# Patient Record
Sex: Male | Born: 1945 | Race: White | Hispanic: No | Marital: Married | Smoking: Former smoker
Health system: Southern US, Community
[De-identification: ages and names within clinical notes are randomized; demographics above are authoritative.]

## PROBLEM LIST (undated history)

## (undated) DIAGNOSIS — L409 Psoriasis, unspecified: Secondary | ICD-10-CM

## (undated) DIAGNOSIS — I10 Essential (primary) hypertension: Secondary | ICD-10-CM

## (undated) DIAGNOSIS — K5792 Diverticulitis of intestine, part unspecified, without perforation or abscess without bleeding: Secondary | ICD-10-CM

## (undated) DIAGNOSIS — N529 Male erectile dysfunction, unspecified: Secondary | ICD-10-CM

## (undated) DIAGNOSIS — I6523 Occlusion and stenosis of bilateral carotid arteries: Secondary | ICD-10-CM

## (undated) DIAGNOSIS — I2510 Atherosclerotic heart disease of native coronary artery without angina pectoris: Secondary | ICD-10-CM

## (undated) DIAGNOSIS — E782 Mixed hyperlipidemia: Secondary | ICD-10-CM

## (undated) DIAGNOSIS — I219 Acute myocardial infarction, unspecified: Secondary | ICD-10-CM

## (undated) HISTORY — DX: Male erectile dysfunction, unspecified: N52.9

## (undated) HISTORY — DX: Acute myocardial infarction, unspecified: I21.9

## (undated) HISTORY — DX: Atherosclerotic heart disease of native coronary artery without angina pectoris: I25.10

## (undated) HISTORY — PX: CORONARY ANGIOPLASTY: SHX604

## (undated) HISTORY — DX: Occlusion and stenosis of bilateral carotid arteries: I65.23

## (undated) HISTORY — PX: TONSILLECTOMY: SUR1361

## (undated) HISTORY — DX: Mixed hyperlipidemia: E78.2

## (undated) HISTORY — DX: Diverticulitis of intestine, part unspecified, without perforation or abscess without bleeding: K57.92

## (undated) HISTORY — DX: Psoriasis, unspecified: L40.9

## (undated) HISTORY — DX: Essential (primary) hypertension: I10

---

## 2004-06-21 ENCOUNTER — Inpatient Hospital Stay: Payer: Self-pay | Admitting: Internal Medicine

## 2010-05-08 ENCOUNTER — Inpatient Hospital Stay: Payer: Self-pay | Admitting: *Deleted

## 2012-05-11 IMAGING — CR DG CHEST 2V
1 series · 2 of 2 positions shown · non-contrast
Comparison: none

REASON FOR EXAM: wheezing
COMMENTS:

PROCEDURE:     DXR - DXR CHEST PA (OR AP) AND LATERAL  - May 08, 2010  [DATE]
RESULT:     Comparison is made to prior study same date earlier time.

[Series 1: view not recorded · 0.17mm/px · 2 of 2 slices shown]
[im 1/2]
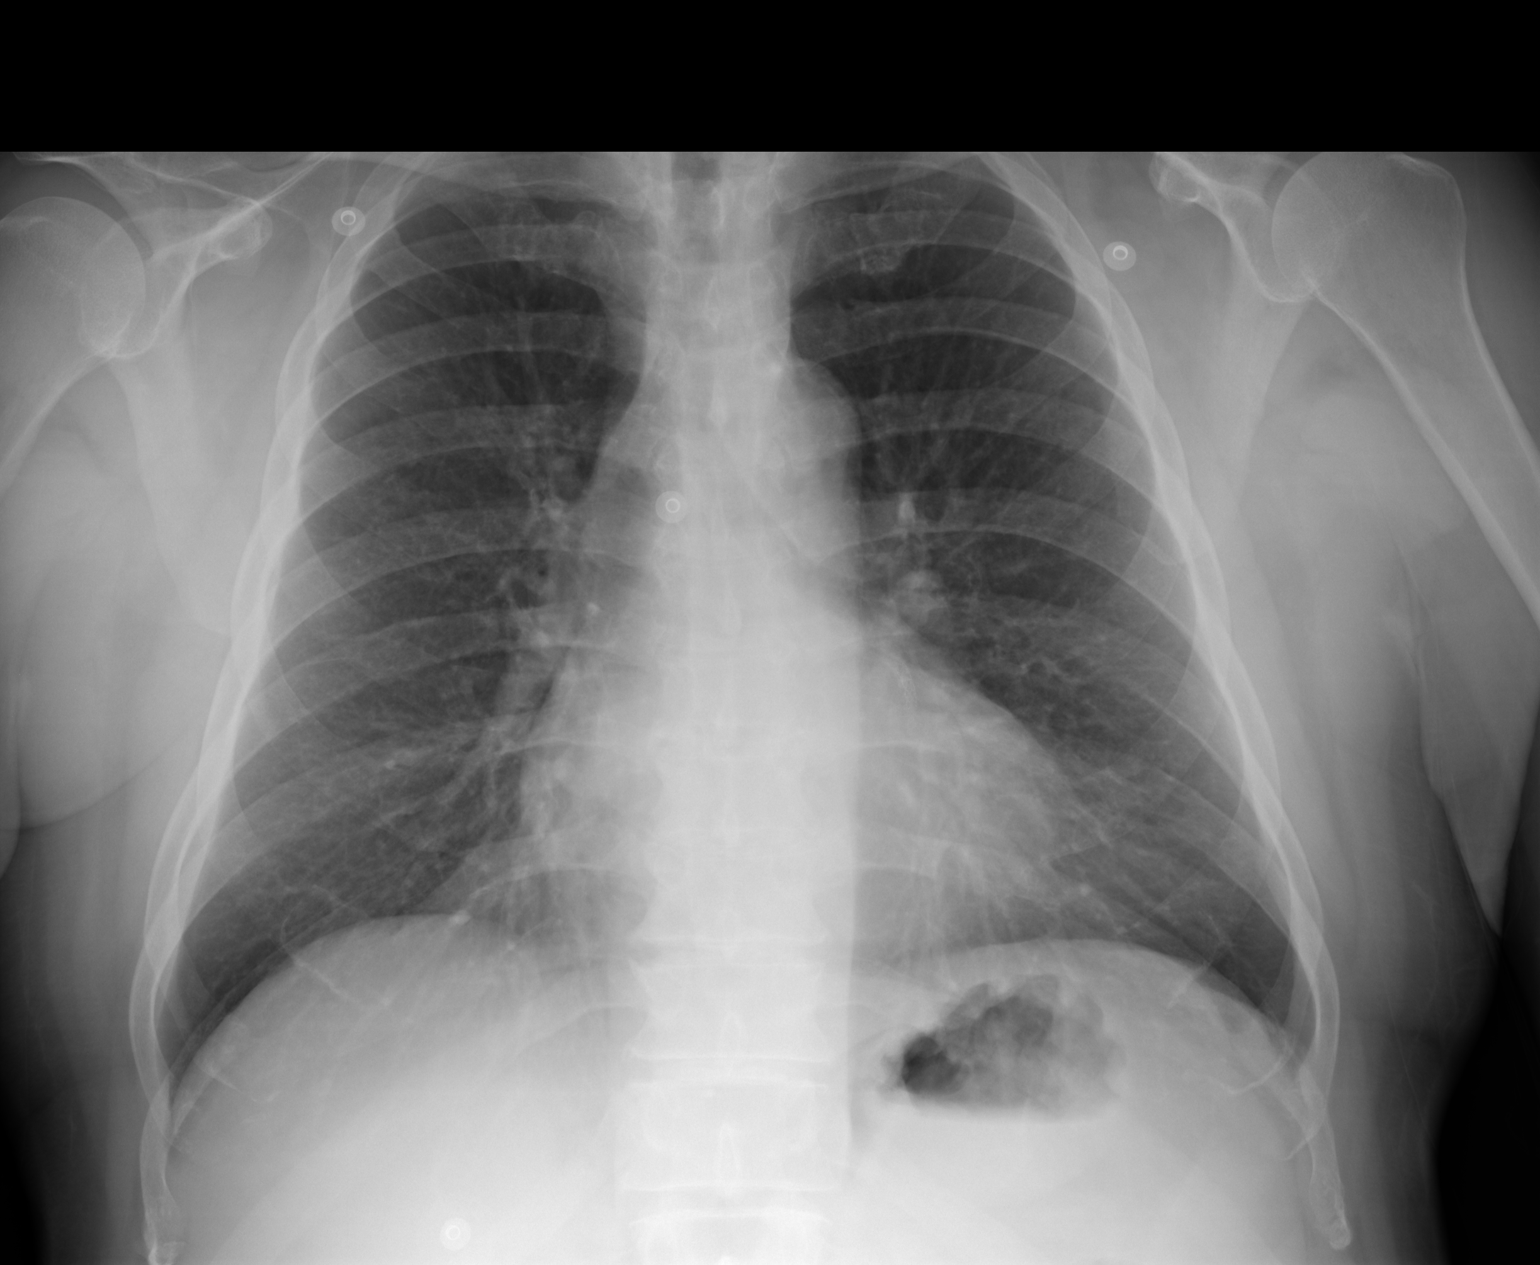
[im 2/2]
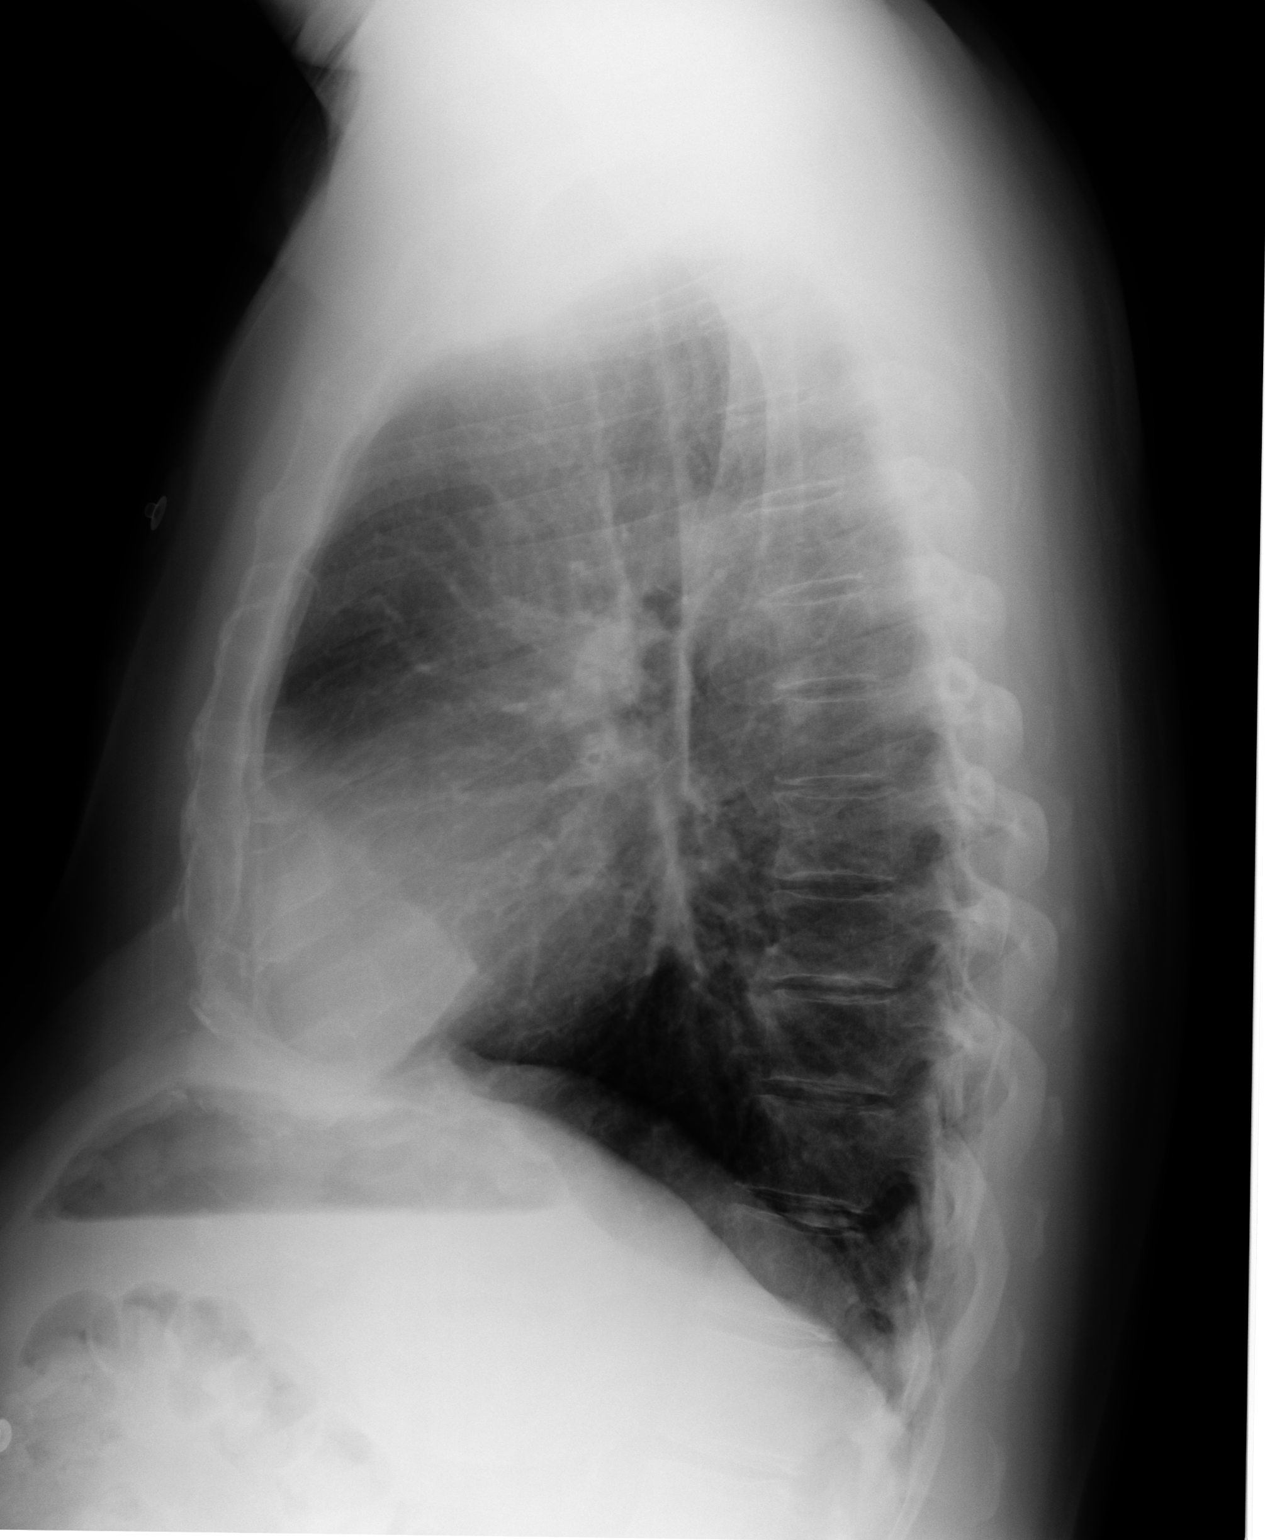

[2 of 2 positions shown; findings below may reference images not displayed]

FINDINGS: There is mild increased prominence of the interstitial markings as
well as a component of cephalization. No focal regions of consolidation or
focal infiltrates are appreciated. The cardiac silhouette and visualized
bony skeleton are unremarkable.
IMPRESSION: Findings possibly representing an element of mild pulmonary
edema. No focal regions of consolidation are appreciated.

## 2012-10-24 DIAGNOSIS — I2510 Atherosclerotic heart disease of native coronary artery without angina pectoris: Secondary | ICD-10-CM | POA: Insufficient documentation

## 2012-10-24 DIAGNOSIS — L409 Psoriasis, unspecified: Secondary | ICD-10-CM | POA: Insufficient documentation

## 2012-11-01 DIAGNOSIS — K5792 Diverticulitis of intestine, part unspecified, without perforation or abscess without bleeding: Secondary | ICD-10-CM | POA: Insufficient documentation

## 2014-02-11 DIAGNOSIS — I2510 Atherosclerotic heart disease of native coronary artery without angina pectoris: Secondary | ICD-10-CM | POA: Insufficient documentation

## 2014-02-11 DIAGNOSIS — I25110 Atherosclerotic heart disease of native coronary artery with unstable angina pectoris: Secondary | ICD-10-CM | POA: Insufficient documentation

## 2014-03-03 ENCOUNTER — Ambulatory Visit: Payer: Self-pay | Admitting: Internal Medicine

## 2014-03-04 LAB — BASIC METABOLIC PANEL
ANION GAP: 7 (ref 7–16)
BUN: 16 mg/dL (ref 7–18)
CALCIUM: 8 mg/dL — AB (ref 8.5–10.1)
CHLORIDE: 104 mmol/L (ref 98–107)
CREATININE: 1.29 mg/dL (ref 0.60–1.30)
Co2: 29 mmol/L (ref 21–32)
EGFR (African American): >60
EGFR (Non-African Amer.): 59 — ABNORMAL LOW
Glucose: 102 mg/dL — ABNORMAL HIGH (ref 65–99)
OSMOLALITY: 281 (ref 275–301)
Potassium: 4.1 mmol/L (ref 3.5–5.1)
Sodium: 140 mmol/L (ref 136–145)

## 2014-03-04 LAB — CK TOTAL AND CKMB (NOT AT ARMC)
CK, TOTAL: 50 U/L (ref 39–308)
CK-MB: 0.6 ng/mL (ref 0.5–3.6)

## 2014-05-20 DIAGNOSIS — E782 Mixed hyperlipidemia: Secondary | ICD-10-CM | POA: Insufficient documentation

## 2014-07-05 NOTE — Discharge Summary (Signed)
PATIENT NAME:  Daniel Miles, Daniel Miles MR#:  [id number] DATE OF BIRTH:  03-13-1946  DATE OF ADMISSION:  03/03/2014 DATE OF DISCHARGE:    DISCHARGE DIAGNOSES:  1.  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  2.  Coronary artery disease.  3.  Hypertension.  4.  Hyperlipidemia.   HISTORY OF PRESENT ILLNESS: This is a 69 year old male with known coronary artery disease status post previous stent placement in the left anterior descending artery, who had progression of shortness of breath, chest discomfort with and without physical activity, becoming unstable at the time with [place] class III anginal equivalent on appropriate medication management including nitrates, beta blocker, ACE inhibitor, aspirin, with no improvement. The patient has had a stress test showing high risk myocardial perfusion defect and myocardial ischemia to the anterior and apical wall with high Duke treadmill score and angina at peak stress. By cardiac catheterization he had an occlusion of the left anterior descending at a mid LAD stent site and underwent a PCI and drug-eluting stent placement at that time without complication, on appropriate medication management. He was given appropriate medication management listed below and ambulated without any further significant symptoms. Cardiac rehabilitation was discussed and the patient understood all risks and benefits of medication management. He reached his maximal hospital benefit and discharged to home with medications listed below.     ____________________________ Daniel BlinksBruce Miles. Daniel Veasey, MD bjk:bu D: 03/03/2014 16:41:46 ET T: 03/03/2014 18:24:34 ET JOB#: [id number]  cc: Daniel BlinksBruce Miles. Daniel Anderegg, MD, <Dictator> Daniel BlinksBRUCE Miles Daniel Splitt MD ELECTRONICALLY SIGNED 03/05/2014 8:11

## 2014-09-24 DIAGNOSIS — I10 Essential (primary) hypertension: Secondary | ICD-10-CM | POA: Insufficient documentation

## 2015-07-27 ENCOUNTER — Encounter: Payer: Self-pay | Admitting: *Deleted

## 2015-07-30 ENCOUNTER — Telehealth: Payer: Self-pay | Admitting: Urology

## 2015-07-30 ENCOUNTER — Ambulatory Visit (INDEPENDENT_AMBULATORY_CARE_PROVIDER_SITE_OTHER): Payer: Medicare HMO | Admitting: Urology

## 2015-07-30 ENCOUNTER — Encounter: Payer: Self-pay | Admitting: Urology

## 2015-07-30 VITALS — BP 122/74 | HR 61 | Ht 66.0 in | Wt 188.1 lb

## 2015-07-30 DIAGNOSIS — N529 Male erectile dysfunction, unspecified: Secondary | ICD-10-CM

## 2015-07-30 DIAGNOSIS — N400 Enlarged prostate without lower urinary tract symptoms: Secondary | ICD-10-CM | POA: Diagnosis not present

## 2015-07-30 DIAGNOSIS — N528 Other male erectile dysfunction: Secondary | ICD-10-CM | POA: Diagnosis not present

## 2015-07-30 NOTE — Telephone Encounter (Signed)
Would you call in Trimix for this patient at [facility]?

## 2015-07-30 NOTE — Progress Notes (Signed)
07/30/2015 5:08 PM   Daniel Miles [birth year] [id number]  Referring provider: No referring provider defined for this encounter.  Chief Complaint  Patient presents with  . Erectile Dysfunction    referred by Feldpausch    HPI: Patient is a 70 year old Caucasian male who is referred to us by Dr. Maryjane HurterFeldpausch for erectile dysfunction.  His SHIM score is 12, which is mild to moderate erectile dysfunction.   He has been having difficulty with erections for the last several years.   His major complaint is he elects confidence to get an adequate erection for sexual activity.  His libido is preserved.   His risk factors for ED are age, CAD, HTN, MI, HLD and anticoagulant therapy.   He denies any painful erections or curvatures with his erections.   He has tried Viagra in the past and it was effective, but he is not taking good candidates for the medication due to his Imdur.      SHIM      07/30/15 1655       SHIM: Over the last 6 months:   How do you rate your confidence that you could get and keep an erection? Very Low     When you had erections with sexual stimulation, how often were your erections hard enough for penetration (entering your partner)? Sometimes (about half the time)     During sexual intercourse, how often were you able to maintain your erection after you had penetrated (entered) your partner? Difficult     During sexual intercourse, how difficult was it to maintain your erection to completion of intercourse? Difficult     When you attempted sexual intercourse, how often was it satisfactory for you? Very Difficult     SHIM Total Score   SHIM 12        Score: 1-7 Severe ED 8-11 Moderate ED 12-16 Mild-Moderate ED 17-21 Mild ED 22-25 No ED   His IPSS score today is 0, which is no lower urinary tract symptomatology. He is pleased with his quality life due to his urinary symptoms.  He denies any dysuria, hematuria or suprapubic pain.  He also denies any  recent fevers, chills, nausea or vomiting.  He does not have a family history of PCa.      IPSS      07/30/15 1600       International Prostate Symptom Score   How often have you had the sensation of not emptying your bladder? Not at All     How often have you had to urinate less than every two hours? Not at All     How often have you found you stopped and started again several times when you urinated? Not at All     How often have you found it difficult to postpone urination? Not at All     How often have you had a weak urinary stream? Not at All     How often have you had to strain to start urination? Not at All     How many times did you typically get up at night to urinate? None     Total IPSS Score 0     Quality of Life due to urinary symptoms   If you were to spend the rest of your life with your urinary condition just the way it is now how would you feel about that? Pleased        Score:  1-7 Mild 8-19 Moderate 20-35 Severe  PMH: Past Medical History  Diagnosis Date  . ASHD (arteriosclerotic heart disease)   . Psoriasis   . Diverticulitis   . ED (erectile dysfunction)   . CAD (coronary artery disease)   . Mixed hyperlipidemia   . HTN (hypertension)   . Heart attack [facility])     Surgical History: Past Surgical History  Procedure Laterality Date  . Tonsillectomy    . Coronary angioplasty      Home Medications:    Medication List       This list is accurate as of: 07/30/15  5:08 PM.  Always use your most recent med list.               aspirin 325 MG tablet  Take by mouth.     atorvastatin 40 MG tablet  Commonly known as:  LIPITOR     clopidogrel 75 MG tablet  Commonly known as:  PLAVIX  Take by mouth.     FISH OIL PO  Take by mouth.     isosorbide mononitrate 30 MG 24 hr tablet  Commonly known as:  IMDUR  Take by mouth.     lisinopril 5 MG tablet  Commonly known as:  PRINIVIL,ZESTRIL  Take by mouth.     metoprolol tartrate 25 MG tablet    Commonly known as:  LOPRESSOR  Take by mouth.     nitroGLYCERIN 0.4 MG SL tablet  Commonly known as:  NITROSTAT  Place under the tongue.     vitamin C 1000 MG tablet  Take 1,000 mg by mouth daily.     Vitamin D3 3000 units Tabs  Take by mouth.     vitamin E 400 UNIT capsule  Take 400 Units by mouth daily.        Allergies:  Allergies  Allergen Reactions  . Penicillin G     Family History: Family History  Problem Relation Age of Onset  . Stroke Mother   . Lung cancer Father   . Hearing loss Brother   . Kidney disease Neg Hx   . Prostate cancer Neg Hx     Social History:  reports that he has quit smoking. He does not have any smokeless tobacco history on file. He reports that he drinks alcohol. He reports that he does not use illicit drugs.  ROS: UROLOGY Frequent Urination?: No Hard to postpone urination?: No Burning/pain with urination?: No Get up at night to urinate?: No Leakage of urine?: No Urine stream starts and stops?: No Trouble starting stream?: No Do you have to strain to urinate?: No Blood in urine?: No Urinary tract infection?: No Sexually transmitted disease?: No Injury to kidneys or bladder?: No Painful intercourse?: No Weak stream?: No Erection problems?: Yes Penile pain?: No  Gastrointestinal Nausea?: No Vomiting?: No Indigestion/heartburn?: No Diarrhea?: No Constipation?: No  Constitutional Fever: No Night sweats?: No Weight loss?: No Fatigue?: No  Skin Skin rash/lesions?: Yes Itching?: No  Eyes Blurred vision?: No Double vision?: No  Ears/Nose/Throat Sore throat?: No Sinus problems?: No  Hematologic/Lymphatic Swollen glands?: No Easy bruising?: No  Cardiovascular Leg swelling?: No Chest pain?: No  Respiratory Cough?: No Shortness of breath?: No  Endocrine Excessive thirst?: No  Musculoskeletal Back pain?: No Joint pain?: Yes  Neurological Headaches?: No Dizziness?: No  Psychologic Depression?:  No Anxiety?: No  Physical Exam: BP 122/74 mmHg  Pulse 61  Ht  (1.676 m)  Wt 188 lb 1.6 oz (85.322 kg)  BMI 30.37 kg/m2  Constitutional: Well nourished. Alert  and oriented, No acute distress. HEENT: [place] AT, moist mucus membranes. Trachea midline, no masses. Cardiovascular: No clubbing, cyanosis, or edema. Respiratory: Normal respiratory effort, no increased work of breathing. GI: Abdomen is soft, non tender, non distended, no abdominal masses. Liver and spleen not palpable.  No hernias appreciated.  Stool sample for occult testing is not indicated.   GU: No CVA tenderness.  No bladder fullness or masses.  Patient with circumcised phallus.  Urethral meatus is patent.  No penile discharge. No penile lesions or rashes. Scrotum without lesions, cysts, rashes and/or edema.  Testicles are located scrotally bilaterally. No masses are appreciated in the testicles. Left and right epididymis are normal. Left varicocele is noted.   Rectal: Patient with  normal sphincter tone. Anus and perineum without scarring or rashes. No rectal masses are appreciated. Prostate is approximately 50 grams, no nodules are appreciated. Seminal vesicles are normal. Skin: No rashes, bruises or suspicious lesions. Lymph: No cervical or inguinal adenopathy. Neurologic: Grossly intact, no focal deficits, moving all 4 extremities. Psychiatric: Normal mood and affect.  Laboratory Data:  Lab Results  Component Value Date   CREATININE 1.29 03/04/2014     Assessment & Plan:    1. Erectile dysfunction:    I explained to the patient that in order to achieve an erection it takes good functioning of the nervous system (parasympathetic, sympathetic, sensory and motor), good blood flow into the erectile tissue of the penis and a desire to have sex.   I stated that conditions like diabetes, hypertension, coronary artery disease, peripheral vascular disease, smoking, alcohol consumption, age and BPH can diminish the ability to  have an erection.   We discussed trying a different PDE5 inhibitor, intra-urethral suppositories, intracavernous vasoactive drug injection therapy, vacuum constriction device and penile prosthesis implantation.  We discussed trying intra-urethral suppositories, intracavernous vasoactive drug injection therapy, vacuum constriction device and penile prosthesis implantation.  He would like to try intracavernous injections.  A prescription for Trimix is called to [facility].  He will contact the office to schedule a test dose injection.  - TSH  2. BPH:   IPSS score is 0/1.  We will continue to monitor.     Return for patient to schedule Trimix injection.  These notes generated with voice recognition software. I apologize for typographical errors.  Michiel Cowboy, PA-C  [facility] [address] [place], [place] [address] [phone]

## 2015-07-31 LAB — TSH: TSH: 1.63 u[IU]/mL (ref 0.450–4.500)

## 2015-08-01 DIAGNOSIS — N529 Male erectile dysfunction, unspecified: Secondary | ICD-10-CM | POA: Insufficient documentation

## 2015-08-01 DIAGNOSIS — N400 Enlarged prostate without lower urinary tract symptoms: Secondary | ICD-10-CM | POA: Insufficient documentation

## 2015-08-03 ENCOUNTER — Telehealth: Payer: Self-pay

## 2015-08-03 NOTE — Telephone Encounter (Signed)
Spoke with pt in reference to lab results. Pt voiced understanding.  

## 2015-08-03 NOTE — Telephone Encounter (Signed)
-----   Message from Harle BattiestShannon A McGowan, PA-C sent at 07/31/2015 10:51 AM EDT ----- Thyroid function is normal.  This is not contributing to his ED.

## 2015-08-03 NOTE — Telephone Encounter (Signed)
Called into [facility] 

## 2015-11-10 ENCOUNTER — Encounter: Payer: Self-pay | Admitting: Family Medicine

## 2016-01-13 DIAGNOSIS — E7800 Pure hypercholesterolemia, unspecified: Secondary | ICD-10-CM | POA: Insufficient documentation

## 2016-07-06 DIAGNOSIS — I10 Essential (primary) hypertension: Secondary | ICD-10-CM | POA: Diagnosis not present

## 2016-07-06 DIAGNOSIS — E782 Mixed hyperlipidemia: Secondary | ICD-10-CM | POA: Diagnosis not present

## 2016-07-06 DIAGNOSIS — I2510 Atherosclerotic heart disease of native coronary artery without angina pectoris: Secondary | ICD-10-CM | POA: Diagnosis not present

## 2016-07-13 DIAGNOSIS — I25118 Atherosclerotic heart disease of native coronary artery with other forms of angina pectoris: Secondary | ICD-10-CM | POA: Diagnosis not present

## 2016-07-13 DIAGNOSIS — E7800 Pure hypercholesterolemia, unspecified: Secondary | ICD-10-CM | POA: Diagnosis not present

## 2016-07-13 DIAGNOSIS — I10 Essential (primary) hypertension: Secondary | ICD-10-CM | POA: Diagnosis not present

## 2016-07-22 DIAGNOSIS — E7800 Pure hypercholesterolemia, unspecified: Secondary | ICD-10-CM | POA: Diagnosis not present

## 2016-11-22 DIAGNOSIS — Z024 Encounter for examination for driving license: Secondary | ICD-10-CM | POA: Diagnosis not present

## 2016-11-22 DIAGNOSIS — I2510 Atherosclerotic heart disease of native coronary artery without angina pectoris: Secondary | ICD-10-CM | POA: Diagnosis not present

## 2016-11-22 DIAGNOSIS — E782 Mixed hyperlipidemia: Secondary | ICD-10-CM | POA: Diagnosis not present

## 2016-11-22 DIAGNOSIS — I10 Essential (primary) hypertension: Secondary | ICD-10-CM | POA: Diagnosis not present

## 2018-10-11 DIAGNOSIS — I6523 Occlusion and stenosis of bilateral carotid arteries: Secondary | ICD-10-CM | POA: Insufficient documentation

## 2018-10-23 ENCOUNTER — Ambulatory Visit: Payer: Self-pay | Admitting: Cardiovascular Disease

## 2018-10-23 ENCOUNTER — Encounter

## 2018-11-01 ENCOUNTER — Encounter (INDEPENDENT_AMBULATORY_CARE_PROVIDER_SITE_OTHER): Payer: Self-pay | Admitting: Vascular Surgery

## 2018-11-01 ENCOUNTER — Other Ambulatory Visit: Payer: Self-pay

## 2018-11-01 ENCOUNTER — Ambulatory Visit (INDEPENDENT_AMBULATORY_CARE_PROVIDER_SITE_OTHER): Payer: Medicare HMO | Admitting: Vascular Surgery

## 2018-11-01 VITALS — BP 137/73 | HR 76 | Resp 10 | Ht 66.0 in | Wt 182.0 lb

## 2018-11-01 DIAGNOSIS — I2510 Atherosclerotic heart disease of native coronary artery without angina pectoris: Secondary | ICD-10-CM | POA: Diagnosis not present

## 2018-11-01 DIAGNOSIS — I6523 Occlusion and stenosis of bilateral carotid arteries: Secondary | ICD-10-CM

## 2018-11-01 DIAGNOSIS — I10 Essential (primary) hypertension: Secondary | ICD-10-CM

## 2018-11-01 DIAGNOSIS — E7800 Pure hypercholesterolemia, unspecified: Secondary | ICD-10-CM

## 2018-11-01 NOTE — Progress Notes (Signed)
MRN : [id number]  Daniel Miles is a 73 y.o. (13-Jan-1946) male who presents with chief complaint of  Chief Complaint  Patient presents with  . New Patient (Initial Visit)    Carotid Stenosis  .  History of Present Illness:   The patient is seen for evaluation of carotid stenosis. The carotid stenosis was identified after a carotid bruit was noted.  The patient denies amaurosis fugax. There is no recent history of TIA symptoms or focal motor deficits. There is no prior documented CVA.  There is no history of migraine headaches. There is no history of seizures.  The patient is taking enteric-coated aspirin 81 mg daily.  The patient has a history of coronary artery disease, no recent episodes of angina or shortness of breath. The patient denies PAD or claudication symptoms. There is a history of hyperlipidemia which is being treated with a statin.   I have personally reviewed the carotid duplex and it shows a 60-79% stenosis of the RICA and <50% stenosis of the LICA   Past Medical History:  Diagnosis Date  . ASHD (arteriosclerotic heart disease)   . CAD (coronary artery disease)   . Diverticulitis   . ED (erectile dysfunction)   . Heart attack ([place])   . HTN (hypertension)   . Mixed hyperlipidemia   . Psoriasis     Past Surgical History:  Procedure Laterality Date  . CORONARY ANGIOPLASTY    . TONSILLECTOMY      Social History Social History   Tobacco Use  . Smoking status: Former Research scientist (life sciences)  . Smokeless tobacco: Never Used  . Tobacco comment: quit over 50 years  Substance Use Topics  . Alcohol use: Yes    Alcohol/week: 0.0 standard drinks    Comment: rarely  . Drug use: No    Family History Family History  Problem Relation Age of Onset  . Stroke Mother   . Lung cancer Father   . Hearing loss Brother   . Kidney disease Neg Hx   . Prostate cancer Neg Hx   No family history of bleeding/clotting disorders, porphyria or autoimmune disease   Allergies   Allergen Reactions  . Penicillin G      REVIEW OF SYSTEMS (Negative unless checked)  Constitutional: [] Weight loss  [] Fever  [] Chills Cardiac: [x] Chest pain   [] Chest pressure   [] Palpitations   [] Shortness of breath when laying flat   [x] Shortness of breath with exertion. Vascular:  [] Pain in legs with walking   [] Pain in legs at rest  [] History of DVT   [] Phlebitis   [] Swelling in legs   [] Varicose veins   [] Non-healing ulcers Pulmonary:   [] Uses home oxygen   [] Productive cough   [] Hemoptysis   [] Wheeze  [] COPD   [] Asthma Neurologic:  [] Dizziness   [] Seizures   [] History of stroke   [] History of TIA  [] Aphasia   [] Vissual changes   [] Weakness or numbness in arm   [] Weakness or numbness in leg Musculoskeletal:   [] Joint swelling   [x] Joint pain   [] Low back pain Hematologic:  [] Easy bruising  [] Easy bleeding   [] Hypercoagulable state   [] Anemic Gastrointestinal:  [] Diarrhea   [] Vomiting  [] Gastroesophageal reflux/heartburn   [] Difficulty swallowing. Genitourinary:  [] Chronic kidney disease   [] Difficult urination  [] Frequent urination   [] Blood in urine Skin:  [] Rashes   [] Ulcers  Psychological:  [] History of anxiety   []  History of major depression.  Physical Examination  Vitals:   11/01/18 1459  BP: 137/73  Pulse: 76  Resp: 10  Weight: 182 lb (82.6 kg)  Height: 5\' 6"  (1.676 m)   Body mass index is 29.38 kg/m. Gen: WD/WN, NAD Head: /AT, No temporalis wasting.  Ear/Nose/Throat: Hearing grossly intact, nares w/o erythema or drainage, poor dentition Eyes: PER, EOMI, sclera nonicteric.  Neck: Supple, no masses.  No bruit or JVD.  Pulmonary:  Good air movement, clear to auscultation bilaterally, no use of accessory muscles.  Cardiac: RRR, normal S1, S2, no Murmurs. Vascular: bilateral 4/6 carotid bruits Vessel Right Left  Radial Palpable Palpable  Brachial Palpable Palpable  Carotid Palpable Palpable  Gastrointestinal: soft, non-distended. No guarding/no peritoneal signs.   Musculoskeletal: M/S 5/5 throughout.  No deformity or atrophy.  Neurologic: CN 2-12 intact. Pain and light touch intact in extremities.  Symmetrical.  Speech is fluent. Motor exam as listed above. Psychiatric: Judgment intact, Mood & affect appropriate for pt's clinical situation. Dermatologic: No rashes or ulcers noted.  No changes consistent with cellulitis. Lymph : No Cervical lymphadenopathy, no lichenification or skin changes of chronic lymphedema.  CBC No results found for: WBC, HGB, HCT, MCV, PLT  BMET    Component Value Date/Time   NA 140 03/04/2014 0115   K 4.1 03/04/2014 0115   CL 104 03/04/2014 0115   CO2 29 03/04/2014 0115   GLUCOSE 102 (H) 03/04/2014 0115   BUN 16 03/04/2014 0115   CREATININE 1.29 03/04/2014 0115   CALCIUM 8.0 (L) 03/04/2014 0115   GFRNONAA 59 (L) 03/04/2014 0115   GFRAA >60 03/04/2014 0115   CrCl cannot be calculated (Patient's most recent lab result is older than the maximum 21 days allowed.).  COAG No results found for: INR, PROTIME  Radiology No results found.   Assessment/Plan 1. Bilateral carotid artery stenosis Recommend:  Given the patient's asymptomatic subcritical stenosis no further invasive testing or surgery at this time.  Duplex ultrasound shows 60-79% RICA stenosis and <50% LICA stenosis.  Continue antiplatelet therapy as prescribed Continue management of CAD, HTN and Hyperlipidemia Healthy heart diet,  encouraged exercise at least 4 times per week Follow up in 6 months with duplex ultrasound and physical exam  - VAS US CAROTID; Future  2. Arteriosclerosis of coronary artery Continue cardiac and antihypertensive medications as already ordered and reviewed, no changes at this time.  Continue statin as ordered and reviewed, no changes at this time  Nitrates PRN for chest pain   3. Benign essential HTN Continue antihypertensive medications as already ordered, these medications have been reviewed and there are no changes  at this time.   4. Pure hypercholesterolemia Continue statin as ordered and reviewed, no changes at this time    Levora DredgeGregory , MD  11/01/2018 3:02 PM

## 2019-05-06 ENCOUNTER — Ambulatory Visit (INDEPENDENT_AMBULATORY_CARE_PROVIDER_SITE_OTHER): Payer: Medicare HMO | Admitting: Vascular Surgery

## 2019-05-06 ENCOUNTER — Encounter (INDEPENDENT_AMBULATORY_CARE_PROVIDER_SITE_OTHER): Payer: Medicare HMO

## 2019-06-17 ENCOUNTER — Encounter (INDEPENDENT_AMBULATORY_CARE_PROVIDER_SITE_OTHER): Payer: Self-pay | Admitting: Vascular Surgery

## 2019-06-17 ENCOUNTER — Ambulatory Visit (INDEPENDENT_AMBULATORY_CARE_PROVIDER_SITE_OTHER): Payer: Medicare HMO

## 2019-06-17 ENCOUNTER — Ambulatory Visit (INDEPENDENT_AMBULATORY_CARE_PROVIDER_SITE_OTHER): Payer: Medicare HMO | Admitting: Vascular Surgery

## 2019-06-17 ENCOUNTER — Other Ambulatory Visit: Payer: Self-pay

## 2019-06-17 VITALS — BP 104/59 | HR 69 | Ht 66.0 in | Wt 186.0 lb

## 2019-06-17 DIAGNOSIS — I10 Essential (primary) hypertension: Secondary | ICD-10-CM | POA: Diagnosis not present

## 2019-06-17 DIAGNOSIS — E7800 Pure hypercholesterolemia, unspecified: Secondary | ICD-10-CM | POA: Diagnosis not present

## 2019-06-17 DIAGNOSIS — I6523 Occlusion and stenosis of bilateral carotid arteries: Secondary | ICD-10-CM

## 2019-06-17 DIAGNOSIS — I2510 Atherosclerotic heart disease of native coronary artery without angina pectoris: Secondary | ICD-10-CM

## 2019-06-17 NOTE — Progress Notes (Signed)
MRN : [id number]  Daniel Miles is a 74 y.o. (02-09-1946) male who presents with chief complaint of  Chief Complaint  Patient presents with  . Follow-up    U/S Follow up  .  History of Present Illness:  The patient is seen for follow up evaluation of carotid stenosis. The carotid stenosis followed by ultrasound.   The patient denies amaurosis fugax. There is no recent history of TIA symptoms or focal motor deficits. There is no prior documented CVA.  The patient is taking enteric-coated aspirin 81 mg daily.  There is no history of migraine headaches. There is no history of seizures.  The patient has a history of coronary artery disease, no recent episodes of angina or shortness of breath. The patient denies PAD or claudication symptoms. There is a history of hyperlipidemia which is being treated with a statin.    Carotid Duplex done today shows RICA 60-79% and LICA 40-59%.  No change compared to last study   No outpatient medications have been marked as taking for the 06/17/19 encounter (Office Visit) with Gilda Crease, Latina Craver, MD.    Past Medical History:  Diagnosis Date  . ASHD (arteriosclerotic heart disease)   . CAD (coronary artery disease)   . Diverticulitis   . ED (erectile dysfunction)   . Heart attack ([facility])   . HTN (hypertension)   . Mixed hyperlipidemia   . Psoriasis     Past Surgical History:  Procedure Laterality Date  . CORONARY ANGIOPLASTY    . TONSILLECTOMY      Social History Social History   Tobacco Use  . Smoking status: Former Games developer  . Smokeless tobacco: Never Used  . Tobacco comment: quit over 50 years  Substance Use Topics  . Alcohol use: Yes    Alcohol/week: 0.0 standard drinks    Comment: rarely  . Drug use: No    Family History Family History  Problem Relation Age of Onset  . Stroke Mother   . Lung cancer Father   . Hearing loss Brother   . Kidney disease Neg Hx   . Prostate cancer Neg Hx     Allergies  Allergen  Reactions  . Penicillin G      REVIEW OF SYSTEMS (Negative unless checked)  Constitutional: [] Weight loss  [] Fever  [] Chills Cardiac: [] Chest pain   [] Chest pressure   [] Palpitations   [] Shortness of breath when laying flat   [] Shortness of breath with exertion. Vascular:  [] Pain in legs with walking   [] Pain in legs at rest  [] History of DVT   [] Phlebitis   [] Swelling in legs   [] Varicose veins   [] Non-healing ulcers Pulmonary:   [] Uses home oxygen   [] Productive cough   [] Hemoptysis   [] Wheeze  [] COPD   [] Asthma Neurologic:  [] Dizziness   [] Seizures   [] History of stroke   [] History of TIA  [] Aphasia   [] Vissual changes   [] Weakness or numbness in arm   [] Weakness or numbness in leg Musculoskeletal:   [] Joint swelling   [] Joint pain   [] Low back pain Hematologic:  [] Easy bruising  [] Easy bleeding   [] Hypercoagulable state   [] Anemic Gastrointestinal:  [] Diarrhea   [] Vomiting  [] Gastroesophageal reflux/heartburn   [] Difficulty swallowing. Genitourinary:  [] Chronic kidney disease   [] Difficult urination  [] Frequent urination   [] Blood in urine Skin:  [] Rashes   [] Ulcers  Psychological:  [] History of anxiety   []  History of major depression.  Physical Examination  Vitals:   06/17/19 1607  BP: (!) 104/59  Pulse: 69  Weight: 186 lb (84.4 kg)  Height: 5\' 6"  (1.676 m)   Body mass index is 30.02 kg/m. Gen: WD/WN, NAD Head: [place]/AT, No temporalis wasting.  Ear/Nose/Throat: Hearing grossly intact, nares w/o erythema or drainage Eyes: PER, EOMI, sclera nonicteric.  Neck: Supple, no large masses.   Pulmonary:  Good air movement, no audible wheezing bilaterally, no use of accessory muscles.  Cardiac: RRR, no JVD Vascular: right carotid bruit Vessel Right Left  Radial Palpable Palpable  Gastrointestinal: Non-distended. No guarding/no peritoneal signs.  Musculoskeletal: M/S 5/5 throughout.  No deformity or atrophy.  Neurologic: CN 2-12 intact. Symmetrical.  Speech is fluent. Motor exam as  listed above. Psychiatric: Judgment intact, Mood & affect appropriate for pt's clinical situation. Dermatologic: No rashes or ulcers noted.  No changes consistent with cellulitis.   CBC No results found for: WBC, HGB, HCT, MCV, PLT  BMET    Component Value Date/Time   NA 140 03/04/2014 0115   K 4.1 03/04/2014 0115   CL 104 03/04/2014 0115   CO2 29 03/04/2014 0115   GLUCOSE 102 (H) 03/04/2014 0115   BUN 16 03/04/2014 0115   CREATININE 1.29 03/04/2014 0115   CALCIUM 8.0 (L) 03/04/2014 0115   GFRNONAA 59 (L) 03/04/2014 0115   GFRAA >60 03/04/2014 0115   CrCl cannot be calculated (Patient's most recent lab result is older than the maximum 21 days allowed.).  COAG No results found for: INR, PROTIME  Radiology No results found.   Assessment/Plan 1. Bilateral carotid artery stenosis Recommend:  Given the patient's asymptomatic subcritical stenosis no further invasive testing or surgery at this time.  Carotid Duplex done today shows RICA 78-46% and LICA 96-29%.  Continue antiplatelet therapy as prescribed Continue management of CAD, HTN and Hyperlipidemia Healthy heart diet,  encouraged exercise at least 4 times per week Follow up in 6 months with duplex ultrasound and physical exam   - VAS US CAROTID; Future  2. Benign essential HTN Continue antihypertensive medications as already ordered, these medications have been reviewed and there are no changes at this time.   3. Arteriosclerosis of coronary artery Continue cardiac and antihypertensive medications as already ordered and reviewed, no changes at this time.  Continue statin as ordered and reviewed, no changes at this time  Nitrates PRN for chest pain   4. Pure hypercholesterolemia Continue statin as ordered and reviewed, no changes at this time    Daniel Pilar, MD  06/17/2019 4:19 PM

## 2019-12-16 ENCOUNTER — Encounter (INDEPENDENT_AMBULATORY_CARE_PROVIDER_SITE_OTHER): Payer: Medicare HMO

## 2019-12-16 ENCOUNTER — Ambulatory Visit (INDEPENDENT_AMBULATORY_CARE_PROVIDER_SITE_OTHER): Payer: Medicare HMO | Admitting: Vascular Surgery

## 2020-01-20 ENCOUNTER — Ambulatory Visit (INDEPENDENT_AMBULATORY_CARE_PROVIDER_SITE_OTHER): Payer: Medicare HMO | Admitting: Vascular Surgery

## 2020-01-20 ENCOUNTER — Encounter (INDEPENDENT_AMBULATORY_CARE_PROVIDER_SITE_OTHER): Payer: Medicare HMO

## 2020-02-13 ENCOUNTER — Encounter (INDEPENDENT_AMBULATORY_CARE_PROVIDER_SITE_OTHER): Payer: Self-pay | Admitting: Vascular Surgery

## 2020-02-13 ENCOUNTER — Other Ambulatory Visit: Payer: Self-pay

## 2020-02-13 ENCOUNTER — Ambulatory Visit (INDEPENDENT_AMBULATORY_CARE_PROVIDER_SITE_OTHER): Payer: Medicare HMO | Admitting: Vascular Surgery

## 2020-02-13 ENCOUNTER — Ambulatory Visit (INDEPENDENT_AMBULATORY_CARE_PROVIDER_SITE_OTHER): Payer: Medicare HMO

## 2020-02-13 VITALS — BP 131/72 | HR 76 | Ht 66.0 in | Wt 185.0 lb

## 2020-02-13 DIAGNOSIS — E7800 Pure hypercholesterolemia, unspecified: Secondary | ICD-10-CM

## 2020-02-13 DIAGNOSIS — I10 Essential (primary) hypertension: Secondary | ICD-10-CM

## 2020-02-13 DIAGNOSIS — I2510 Atherosclerotic heart disease of native coronary artery without angina pectoris: Secondary | ICD-10-CM

## 2020-02-13 DIAGNOSIS — I6523 Occlusion and stenosis of bilateral carotid arteries: Secondary | ICD-10-CM | POA: Diagnosis not present

## 2020-02-18 ENCOUNTER — Encounter (INDEPENDENT_AMBULATORY_CARE_PROVIDER_SITE_OTHER): Payer: Self-pay | Admitting: Vascular Surgery

## 2020-02-18 NOTE — Progress Notes (Signed)
MRN : [id number]  Daniel Miles is a 74 y.o. (1945-10-28) male who presents with chief complaint of  Chief Complaint  Patient presents with  . Follow-up    47mo U/S follow up  .  History of Present Illness: The patient is seen for follow up evaluation of carotid stenosis. The carotid stenosis followed by ultrasound.   The patient denies amaurosis fugax. There is no recent history of TIA symptoms or focal motor deficits. There is no prior documented CVA.  The patient is taking enteric-coated aspirin 81 mg daily.  There is no history of migraine headaches. There is no history of seizures.  The patient has a history of coronary artery disease, no recent episodes of angina or shortness of breath. The patient denies PAD or claudication symptoms. There is a history of hyperlipidemia which is being treated with a statin.   Carotid Duplex done today shows RICA 60-79% and LICA <40%.  No significant change compared to last study   Current Meds  Medication Sig  . aspirin 325 MG tablet Take by mouth.  Marland Kitchen atorvastatin (LIPITOR) 40 MG tablet 40 mg.   . Cholecalciferol (VITAMIN D3) 3000 units TABS Take by mouth.  . clopidogrel (PLAVIX) 75 MG tablet Take by mouth.  Marland Kitchen glucosamine-chondroitin 500-400 MG tablet Take 1 tablet by mouth 3 (three) times daily.  . Multiple Vitamins-Minerals (SPECTRAVITE PO) Take by mouth.  . Omega-3 Fatty Acids (FISH OIL PO) Take by mouth.    Past Medical History:  Diagnosis Date  . ASHD (arteriosclerotic heart disease)   . CAD (coronary artery disease)   . Diverticulitis   . ED (erectile dysfunction)   . Heart attack (HCC)   . HTN (hypertension)   . Mixed hyperlipidemia   . Psoriasis     Past Surgical History:  Procedure Laterality Date  . CORONARY ANGIOPLASTY    . TONSILLECTOMY      Social History Social History   Tobacco Use  . Smoking status: Former Games developer  . Smokeless tobacco: Never Used  . Tobacco comment: quit over 50 years   Substance Use Topics  . Alcohol use: Yes    Alcohol/week: 0.0 standard drinks    Comment: rarely  . Drug use: No    Family History Family History  Problem Relation Age of Onset  . Stroke Mother   . Lung cancer Father   . Hearing loss Brother   . Kidney disease Neg Hx   . Prostate cancer Neg Hx     Allergies  Allergen Reactions  . Penicillin G      REVIEW OF SYSTEMS (Negative unless checked)  Constitutional: [] Weight loss  [] Fever  [] Chills Cardiac: [] Chest pain   [] Chest pressure   [] Palpitations   [] Shortness of breath when laying flat   [] Shortness of breath with exertion. Vascular:  [] Pain in legs with walking   [] Pain in legs at rest  [] History of DVT   [] Phlebitis   [] Swelling in legs   [] Varicose veins   [] Non-healing ulcers Pulmonary:   [] Uses home oxygen   [] Productive cough   [] Hemoptysis   [] Wheeze  [] COPD   [] Asthma Neurologic:  [] Dizziness   [] Seizures   [] History of stroke   [] History of TIA  [] Aphasia   [] Vissual changes   [] Weakness or numbness in arm   [] Weakness or numbness in leg Musculoskeletal:   [] Joint swelling   [] Joint pain   [] Low back pain Hematologic:  [] Easy bruising  [] Easy bleeding   [] Hypercoagulable state   []   Anemic Gastrointestinal:  [] Diarrhea   [] Vomiting  [] Gastroesophageal reflux/heartburn   [] Difficulty swallowing. Genitourinary:  [] Chronic kidney disease   [] Difficult urination  [] Frequent urination   [] Blood in urine Skin:  [] Rashes   [] Ulcers  Psychological:  [] History of anxiety   []  History of major depression.  Physical Examination  Vitals:   02/13/20 1551  BP: 131/72  Pulse: 76  Weight: 185 lb (83.9 kg)  Height: 5\' 6"  (1.676 m)   Body mass index is 29.86 kg/m. Gen: WD/WN, NAD Head: [place]/AT, No temporalis wasting.  Ear/Nose/Throat: Hearing grossly intact, nares w/o erythema or drainage Eyes: PER, EOMI, sclera nonicteric.  Neck: Supple, no large masses.   Pulmonary:  Good air movement, no audible wheezing bilaterally, no  use of accessory muscles.  Cardiac: RRR, no JVD Vascular: right carotid bruit Vessel Right Left  Radial Palpable Palpable  Carotid Palpable Palpable  Gastrointestinal: Non-distended. No guarding/no peritoneal signs.  Musculoskeletal: M/S 5/5 throughout.  No deformity or atrophy.  Neurologic: CN 2-12 intact. Symmetrical.  Speech is fluent. Motor exam as listed above. Psychiatric: Judgment intact, Mood & affect appropriate for pt's clinical situation. Dermatologic: No rashes or ulcers noted.  No changes consistent with cellulitis.   CBC No results found for: WBC, HGB, HCT, MCV, PLT  BMET    Component Value Date/Time   NA 140 03/04/2014 0115   K 4.1 03/04/2014 0115   CL 104 03/04/2014 0115   CO2 29 03/04/2014 0115   GLUCOSE 102 (H) 03/04/2014 0115   BUN 16 03/04/2014 0115   CREATININE 1.29 03/04/2014 0115   CALCIUM 8.0 (L) 03/04/2014 0115   GFRNONAA 59 (L) 03/04/2014 0115   GFRAA >60 03/04/2014 0115   CrCl cannot be calculated (Patient's most recent lab result is older than the maximum 21 days allowed.).  COAG No results found for: INR, PROTIME  Radiology VAS CAROTID  Result Date: 02/13/2020 Carotid Arterial Duplex Study Indications:       Carotid artery disease. Comparison Study:  06/17/2019 Performing Technologist: 03/06/2014 RVS  Examination Guidelines: A complete evaluation includes B-mode imaging, spectral Doppler, color Doppler, and power Doppler as needed of all accessible portions of each vessel. Bilateral testing is considered an integral part of a complete examination. Limited examinations for reoccurring indications may be performed as noted.  Right Carotid Findings: +----------+-------+-------+--------+---------------------------------+--------+           PSV    EDV    StenosisPlaque Description               Comments           cm/s   cm/s                                                      +----------+-------+-------+--------+---------------------------------+--------+ CCA Prox  101    14                                                       +----------+-------+-------+--------+---------------------------------+--------+ CCA Mid   103    19                                                       +----------+-------+-------+--------+---------------------------------+--------+  CCA Distal102    25                                                       +----------+-------+-------+--------+---------------------------------+--------+ ICA Prox  286    60     60-79%  heterogenous, irregular and                                               calcific                                  +----------+-------+-------+--------+---------------------------------+--------+ ICA Mid   284    44                                                       +----------+-------+-------+--------+---------------------------------+--------+ ICA Distal76     18                                                       +----------+-------+-------+--------+---------------------------------+--------+ ECA       246    25             heterogenous, irregular and                                               calcific                                  +----------+-------+-------+--------+---------------------------------+--------+ +----------+--------+-------+--------+-------------------+           PSV cm/sEDV cmsDescribeArm Pressure (mmHG) +----------+--------+-------+--------+-------------------+ Subclavian185     0                                  +----------+--------+-------+--------+-------------------+ +---------+--------+--+--------+--+ VertebralPSV cm/s32EDV cm/s10 +---------+--------+--+--------+--+  Left Carotid Findings: +----------+--------+--------+--------+------------------+--------+           PSV cm/sEDV cm/sStenosisPlaque DescriptionComments  +----------+--------+--------+--------+------------------+--------+ CCA Prox  121     20                                         +----------+--------+--------+--------+------------------+--------+ CCA Mid   122     18                                         +----------+--------+--------+--------+------------------+--------+ CCA Distal87      15                                         +----------+--------+--------+--------+------------------+--------+  ICA Prox  97      20                                         +----------+--------+--------+--------+------------------+--------+ ICA Mid   110     23                                         +----------+--------+--------+--------+------------------+--------+ ICA Distal101     21                                         +----------+--------+--------+--------+------------------+--------+ ECA       243     21                                         +----------+--------+--------+--------+------------------+--------+ +----------+--------+--------+--------+-------------------+           PSV cm/sEDV cm/sDescribeArm Pressure (mmHG) +----------+--------+--------+--------+-------------------+ Subclavian162     0                                   +----------+--------+--------+--------+-------------------+  +---------+--------+-+--------+-+ VertebralPSV cm/s0EDV cm/s0 +---------+--------+-+--------+-+   Summary: Right Carotid: Velocities in the right ICA are consistent with a 60-79%                stenosis. Left Carotid: Velocities in the left ICA are consistent with a 1-39% stenosis. Vertebrals:  Right vertebral artery demonstrates antegrade flow. Left vertebral              artery demonstrates no discernable flow. Subclavians: Normal flow hemodynamics were seen in bilateral subclavian              arteries. *See table(s) above for measurements and observations.  Electronically signed by Levora DredgeGregory Bronsyn Shappell MD on  02/13/2020 at 4:15:02 PM.    Final      Assessment/Plan 1. Bilateral carotid artery stenosis Recommend:  Given the patient's asymptomatic subcritical stenosis no further invasive testing or surgery at this time.  Carotid Duplex done today shows RICA 60-79% and LICA <40%.  Continue antiplatelet therapy as prescribed Continue management of CAD, HTN and Hyperlipidemia Healthy heart diet,  encouraged exercise at least 4 times per week Follow up in 6 months with duplex ultrasound and physical exam  - VAS US CAROTID; Future  2. Arteriosclerosis of coronary artery Continue cardiac and antihypertensive medications as already ordered and reviewed, no changes at this time.  Continue statin as ordered and reviewed, no changes at this time  Nitrates PRN for chest pain   3. Benign essential HTN Continue antihypertensive medications as already ordered, these medications have been reviewed and there are no changes at this time.   4. Pure hypercholesterolemia Continue statin as ordered and reviewed, no changes at this time     Levora DredgeGregory Loneta Tamplin, MD  02/18/2020 9:09 PM

## 2020-08-12 NOTE — Progress Notes (Signed)
MRN : [id number]  Daniel Miles is a 75 y.o. (1945/03/26) male who presents with chief complaint of No chief complaint on file. Marland Kitchen  History of Present Illness:   The patient is seen for follow up evaluation of carotid stenosis. The carotid stenosis followed by ultrasound.   The patient denies amaurosis fugax. There is no recent history of TIA symptoms or focal motor deficits. There is no prior documented CVA.  The patient is taking enteric-coated aspirin 81 mg daily.  There is no history of migraine headaches. There is no history of seizures.  The patient has a history of coronary artery disease, no recent episodes of angina or shortness of breath. The patient denies PAD or claudication symptoms. There is a history of hyperlipidemia which is being treated with a statin.   Carotid Duplex done today showsRICA 60-79% and LICA <40%. No significant change compared to last study  No outpatient medications have been marked as taking for the 08/13/20 encounter (Appointment) with Gilda Crease, Latina Craver, MD.    Past Medical History:  Diagnosis Date  . ASHD (arteriosclerotic heart disease)   . CAD (coronary artery disease)   . Diverticulitis   . ED (erectile dysfunction)   . Heart attack (HCC)   . HTN (hypertension)   . Mixed hyperlipidemia   . Psoriasis     Past Surgical History:  Procedure Laterality Date  . CORONARY ANGIOPLASTY    . TONSILLECTOMY      Social History Social History   Tobacco Use  . Smoking status: Former Games developer  . Smokeless tobacco: Never Used  . Tobacco comment: quit over 50 years  Substance Use Topics  . Alcohol use: Yes    Alcohol/week: 0.0 standard drinks    Comment: rarely  . Drug use: No    Family History Family History  Problem Relation Age of Onset  . Stroke Mother   . Lung cancer Father   . Hearing loss Brother   . Kidney disease Neg Hx   . Prostate cancer Neg Hx     Allergies  Allergen Reactions  . Penicillin G       REVIEW OF SYSTEMS (Negative unless checked)  Constitutional: [] Weight loss  [] Fever  [] Chills Cardiac: [] Chest pain   [] Chest pressure   [] Palpitations   [] Shortness of breath when laying flat   [] Shortness of breath with exertion. Vascular:  [] Pain in legs with walking   [] Pain in legs at rest  [] History of DVT   [] Phlebitis   [] Swelling in legs   [] Varicose veins   [] Non-healing ulcers Pulmonary:   [] Uses home oxygen   [] Productive cough   [] Hemoptysis   [] Wheeze  [] COPD   [] Asthma Neurologic:  [] Dizziness   [] Seizures   [] History of stroke   [] History of TIA  [] Aphasia   [] Vissual changes   [] Weakness or numbness in arm   [] Weakness or numbness in leg Musculoskeletal:   [] Joint swelling   [] Joint pain   [] Low back pain Hematologic:  [] Easy bruising  [] Easy bleeding   [] Hypercoagulable state   [] Anemic Gastrointestinal:  [] Diarrhea   [] Vomiting  [] Gastroesophageal reflux/heartburn   [] Difficulty swallowing. Genitourinary:  [] Chronic kidney disease   [] Difficult urination  [] Frequent urination   [] Blood in urine Skin:  [] Rashes   [] Ulcers  Psychological:  [] History of anxiety   []  History of major depression.  Physical Examination  There were no vitals filed for this visit. There is no height or weight on file to calculate BMI. Gen: WD/WN,  NAD Head: [place]/AT, No temporalis wasting.  Ear/Nose/Throat: Hearing grossly intact, nares w/o erythema or drainage Eyes: PER, EOMI, sclera nonicteric.  Neck: Supple, no large masses.   Pulmonary:  Good air movement, no audible wheezing bilaterally, no use of accessory muscles.  Cardiac: RRR, no JVD Vascular: right carotid bruit Vessel Right Left  Radial Palpable Palpable  Carotid Palpable Palpable  Gastrointestinal: Non-distended. No guarding/no peritoneal signs.  Musculoskeletal: M/S 5/5 throughout.  No deformity or atrophy.  Neurologic: CN 2-12 intact. Symmetrical.  Speech is fluent. Motor exam as listed above. Psychiatric: Judgment intact,  Mood & affect appropriate for pt's clinical situation. Dermatologic: No rashes or ulcers noted.  No changes consistent with cellulitis.  CBC No results found for: WBC, HGB, HCT, MCV, PLT  BMET    Component Value Date/Time   NA 140 03/04/2014 0115   K 4.1 03/04/2014 0115   CL 104 03/04/2014 0115   CO2 29 03/04/2014 0115   GLUCOSE 102 (H) 03/04/2014 0115   BUN 16 03/04/2014 0115   CREATININE 1.29 03/04/2014 0115   CALCIUM 8.0 (L) 03/04/2014 0115   GFRNONAA 59 (L) 03/04/2014 0115   GFRAA >60 03/04/2014 0115   CrCl cannot be calculated (Patient's most recent lab result is older than the maximum 21 days allowed.).  COAG No results found for: INR, PROTIME  Radiology No results found.   Assessment/Plan 1. Bilateral carotid artery stenosis Recommend:  Given the patient's asymptomatic subcritical stenosis no further invasive testing or surgery at this time.  Carotid Duplex done today showsRICA 60-79% and LICA <40%.  Continue antiplatelet therapy as prescribed Continue management of CAD, HTN and Hyperlipidemia Healthy heart diet, encouraged exercise at least 4 times per week Follow up in74months with duplex ultrasound and physical exam  - VAS US CAROTID; Future  2. Arteriosclerosis of coronary artery Continue cardiac and antihypertensive medications as already ordered and reviewed, no changes at this time.  Continue statin as ordered and reviewed, no changes at this time  Nitrates PRN for chest pain   3. Benign essential HTN Continue antihypertensive medications as already ordered, these medications have been reviewed and there are no changes at this time.   4. Pure hypercholesterolemia Continue statin as ordered and reviewed, no changes at this time    Levora Dredge, MD  08/12/2020 6:00 PM

## 2020-08-13 ENCOUNTER — Ambulatory Visit (INDEPENDENT_AMBULATORY_CARE_PROVIDER_SITE_OTHER): Payer: Medicare HMO

## 2020-08-13 ENCOUNTER — Ambulatory Visit (INDEPENDENT_AMBULATORY_CARE_PROVIDER_SITE_OTHER): Payer: Medicare HMO | Admitting: Vascular Surgery

## 2020-08-13 ENCOUNTER — Other Ambulatory Visit: Payer: Self-pay

## 2020-08-13 VITALS — BP 105/62 | HR 72 | Ht 67.0 in | Wt 183.0 lb

## 2020-08-13 DIAGNOSIS — IMO0001 Reserved for inherently not codable concepts without codable children: Secondary | ICD-10-CM | POA: Insufficient documentation

## 2020-08-13 DIAGNOSIS — I259 Chronic ischemic heart disease, unspecified: Secondary | ICD-10-CM | POA: Insufficient documentation

## 2020-08-13 DIAGNOSIS — I2510 Atherosclerotic heart disease of native coronary artery without angina pectoris: Secondary | ICD-10-CM | POA: Diagnosis not present

## 2020-08-13 DIAGNOSIS — I10 Essential (primary) hypertension: Secondary | ICD-10-CM

## 2020-08-13 DIAGNOSIS — K219 Gastro-esophageal reflux disease without esophagitis: Secondary | ICD-10-CM | POA: Insufficient documentation

## 2020-08-13 DIAGNOSIS — E7800 Pure hypercholesterolemia, unspecified: Secondary | ICD-10-CM

## 2020-08-13 DIAGNOSIS — I6523 Occlusion and stenosis of bilateral carotid arteries: Secondary | ICD-10-CM | POA: Diagnosis not present

## 2020-08-15 ENCOUNTER — Encounter (INDEPENDENT_AMBULATORY_CARE_PROVIDER_SITE_OTHER): Payer: Self-pay | Admitting: Vascular Surgery

## 2021-02-11 ENCOUNTER — Encounter (INDEPENDENT_AMBULATORY_CARE_PROVIDER_SITE_OTHER): Payer: Medicare HMO

## 2021-02-11 ENCOUNTER — Ambulatory Visit (INDEPENDENT_AMBULATORY_CARE_PROVIDER_SITE_OTHER): Payer: Medicare HMO | Admitting: Vascular Surgery

## 2021-03-17 ENCOUNTER — Other Ambulatory Visit (INDEPENDENT_AMBULATORY_CARE_PROVIDER_SITE_OTHER): Payer: Self-pay | Admitting: Vascular Surgery

## 2021-03-17 DIAGNOSIS — I6523 Occlusion and stenosis of bilateral carotid arteries: Secondary | ICD-10-CM

## 2021-03-18 ENCOUNTER — Ambulatory Visit (INDEPENDENT_AMBULATORY_CARE_PROVIDER_SITE_OTHER): Payer: Medicare HMO

## 2021-03-18 ENCOUNTER — Other Ambulatory Visit: Payer: Self-pay

## 2021-03-18 ENCOUNTER — Ambulatory Visit (INDEPENDENT_AMBULATORY_CARE_PROVIDER_SITE_OTHER): Payer: Medicare HMO | Admitting: Vascular Surgery

## 2021-03-18 ENCOUNTER — Encounter (INDEPENDENT_AMBULATORY_CARE_PROVIDER_SITE_OTHER): Payer: Self-pay | Admitting: Vascular Surgery

## 2021-03-18 VITALS — BP 144/63 | HR 78 | Resp 17 | Ht 66.0 in | Wt 187.0 lb

## 2021-03-18 DIAGNOSIS — I6523 Occlusion and stenosis of bilateral carotid arteries: Secondary | ICD-10-CM

## 2021-03-18 DIAGNOSIS — I2510 Atherosclerotic heart disease of native coronary artery without angina pectoris: Secondary | ICD-10-CM

## 2021-03-18 DIAGNOSIS — I10 Essential (primary) hypertension: Secondary | ICD-10-CM | POA: Diagnosis not present

## 2021-03-18 DIAGNOSIS — E7800 Pure hypercholesterolemia, unspecified: Secondary | ICD-10-CM | POA: Diagnosis not present

## 2021-03-18 NOTE — Progress Notes (Signed)
MRN : [id number]  Daniel Miles is a 76 y.o. ([birth year]) male who presents with chief complaint of check carotid arteries.  History of Present Illness:     The patient is seen for follow up evaluation of carotid stenosis. The carotid stenosis followed by ultrasound.    The patient denies amaurosis fugax. There is no recent history of TIA symptoms or focal motor deficits. There is no prior documented CVA.   The patient is taking enteric-coated aspirin 81 mg daily.   There is no history of migraine headaches. There is no history of seizures.   The patient has a history of coronary artery disease, no recent episodes of angina or shortness of breath. The patient denies PAD or claudication symptoms. There is a history of hyperlipidemia which is being treated with a statin.     Carotid Duplex done today shows RICA [id number] and LICA [id number].  No significant change compared to last study  No outpatient medications have been marked as taking for the 03/18/21 encounter (Appointment) with Delana Meyer, Dolores Lory, MD.    Past Medical History:  Diagnosis Date   ASHD (arteriosclerotic heart disease)    CAD (coronary artery disease)    Diverticulitis    ED (erectile dysfunction)    Heart attack ([facility])    HTN (hypertension)    Mixed hyperlipidemia    Psoriasis     Past Surgical History:  Procedure Laterality Date   CORONARY ANGIOPLASTY     TONSILLECTOMY      Social History Social History   Tobacco Use   Smoking status: Former   Smokeless tobacco: Never   Tobacco comments:    quit over 50 years  Substance Use Topics   Alcohol use: Yes    Alcohol/week: 0.0 standard drinks    Comment: rarely   Drug use: No    Family History Family History  Problem Relation Age of Onset   Stroke Mother    Lung cancer Father    Hearing loss Brother    Kidney disease Neg Hx    Prostate cancer Neg Hx     Allergies  Allergen Reactions   Penicillin G      REVIEW OF SYSTEMS (Negative  unless checked)  Constitutional: [] Weight loss  [] Fever  [] Chills Cardiac: [] Chest pain   [] Chest pressure   [] Palpitations   [] Shortness of breath when laying flat   [] Shortness of breath with exertion. Vascular:  [] Pain in legs with walking   [] Pain in legs at rest  [] History of DVT   [] Phlebitis   [] Swelling in legs   [] Varicose veins   [] Non-healing ulcers Pulmonary:   [] Uses home oxygen   [] Productive cough   [] Hemoptysis   [] Wheeze  [] COPD   [] Asthma Neurologic:  [] Dizziness   [] Seizures   [] History of stroke   [] History of TIA  [] Aphasia   [] Vissual changes   [] Weakness or numbness in arm   [] Weakness or numbness in leg Musculoskeletal:   [] Joint swelling   [] Joint pain   [] Low back pain Hematologic:  [] Easy bruising  [] Easy bleeding   [] Hypercoagulable state   [] Anemic Gastrointestinal:  [] Diarrhea   [] Vomiting  [x] Gastroesophageal reflux/heartburn   [] Difficulty swallowing. Genitourinary:  [] Chronic kidney disease   [] Difficult urination  [] Frequent urination   [] Blood in urine Skin:  [] Rashes   [] Ulcers  Psychological:  [] History of anxiety   []  History of major depression.  Physical Examination  There were no vitals filed for this visit. There is no height or  weight on file to calculate BMI. Gen: WD/WN, NAD Head: /AT, No temporalis wasting.  Ear/Nose/Throat: Hearing grossly intact, nares w/o erythema or drainage Eyes: PER, EOMI, sclera nonicteric.  Neck: Supple, no masses.  No bruit or JVD.  Pulmonary:  Good air movement, no audible wheezing, no use of accessory muscles.  Cardiac: RRR, normal S1, S2, no Murmurs. Vascular:  right carotid bruit Vessel Right Left  Radial Palpable Palpable  Carotid Palpable Palpable  Gastrointestinal: soft, non-distended. No guarding/no peritoneal signs.  Musculoskeletal: M/S 5/5 throughout.  No visible deformity.  Neurologic: CN 2-12 intact. Pain and light touch intact in extremities.  Symmetrical.  Speech is fluent. Motor exam as listed  above. Psychiatric: Judgment intact, Mood & affect appropriate for pt's clinical situation. Dermatologic: No rashes or ulcers noted.  No changes consistent with cellulitis.   CBC No results found for: WBC, HGB, HCT, MCV, PLT  BMET    Component Value Date/Time   NA 140 03/04/2014 0115   K 4.1 03/04/2014 0115   CL 104 03/04/2014 0115   CO2 29 03/04/2014 0115   GLUCOSE 102 (H) 03/04/2014 0115   BUN 16 03/04/2014 0115   CREATININE 1.29 03/04/2014 0115   CALCIUM 8.0 (L) 03/04/2014 0115   GFRNONAA 59 (L) 03/04/2014 0115   GFRAA >60 03/04/2014 0115   CrCl cannot be calculated (Patient's most recent lab result is older than the maximum 21 days allowed.).  COAG No results found for: INR, PROTIME  Radiology No results found.   Assessment/Plan 1. Bilateral carotid artery stenosis Recommend:   Given the patient's asymptomatic subcritical stenosis no further invasive testing or surgery at this time.   Carotid Duplex done today shows RICA [id number] and LICA [id number].   Continue antiplatelet therapy as prescribed Continue management of CAD, HTN and Hyperlipidemia Healthy heart diet,  encouraged exercise at least 4 times per week Follow up in 6 months with duplex ultrasound and physical exam   - VAS US CAROTID; Future  2. Arteriosclerosis of coronary artery Continue cardiac and antihypertensive medications as already ordered and reviewed, no changes at this time.  Continue statin as ordered and reviewed, no changes at this time  Nitrates PRN for chest pain   3. Benign essential HTN Continue antihypertensive medications as already ordered, these medications have been reviewed and there are no changes at this time.   4. Pure hypercholesterolemia Continue statin as ordered and reviewed, no changes at this time     [name], MD  03/18/2021 1:15 PM

## 2021-03-21 ENCOUNTER — Encounter (INDEPENDENT_AMBULATORY_CARE_PROVIDER_SITE_OTHER): Payer: Self-pay | Admitting: Vascular Surgery

## 2021-09-09 ENCOUNTER — Ambulatory Visit (INDEPENDENT_AMBULATORY_CARE_PROVIDER_SITE_OTHER): Payer: Medicare HMO | Admitting: Vascular Surgery

## 2021-09-09 ENCOUNTER — Ambulatory Visit (INDEPENDENT_AMBULATORY_CARE_PROVIDER_SITE_OTHER): Payer: Medicare HMO

## 2021-09-09 DIAGNOSIS — I6523 Occlusion and stenosis of bilateral carotid arteries: Secondary | ICD-10-CM | POA: Diagnosis not present

## 2021-09-16 ENCOUNTER — Ambulatory Visit (INDEPENDENT_AMBULATORY_CARE_PROVIDER_SITE_OTHER): Payer: Medicare HMO | Admitting: Vascular Surgery

## 2021-09-16 ENCOUNTER — Encounter (INDEPENDENT_AMBULATORY_CARE_PROVIDER_SITE_OTHER): Payer: Medicare HMO

## 2021-10-21 ENCOUNTER — Ambulatory Visit (INDEPENDENT_AMBULATORY_CARE_PROVIDER_SITE_OTHER): Payer: Medicare HMO | Admitting: Vascular Surgery

## 2021-10-21 ENCOUNTER — Encounter (INDEPENDENT_AMBULATORY_CARE_PROVIDER_SITE_OTHER): Payer: Self-pay | Admitting: Vascular Surgery

## 2021-10-21 VITALS — BP 126/71 | HR 57 | Resp 18 | Ht 65.0 in | Wt 184.0 lb

## 2021-10-21 DIAGNOSIS — I6523 Occlusion and stenosis of bilateral carotid arteries: Secondary | ICD-10-CM

## 2021-10-21 DIAGNOSIS — E7800 Pure hypercholesterolemia, unspecified: Secondary | ICD-10-CM

## 2021-10-21 DIAGNOSIS — I2510 Atherosclerotic heart disease of native coronary artery without angina pectoris: Secondary | ICD-10-CM

## 2021-10-21 DIAGNOSIS — I10 Essential (primary) hypertension: Secondary | ICD-10-CM

## 2021-10-21 NOTE — Progress Notes (Signed)
MRN : [id number]  Daniel Miles is a 76 y.o. (August 26, 1945) male who presents with chief complaint of check carotid arteries.  History of Present Illness: The patient is seen for follow up evaluation of carotid stenosis. The carotid stenosis followed by ultrasound.    The patient denies amaurosis fugax. There is no recent history of TIA symptoms or focal motor deficits. There is no prior documented CVA.   The patient is taking enteric-coated aspirin 81 mg daily.   There is no history of migraine headaches. There is no history of seizures.   The patient has a history of coronary artery disease, no recent episodes of angina or shortness of breath. The patient denies PAD or claudication symptoms. There is a history of hyperlipidemia which is being treated with a statin.     Carotid Duplex done 09/09/2021 shows RICA 60-79% and LICA <40%.  No significant change compared to last study  No outpatient medications have been marked as taking for the 10/21/21 encounter (Appointment) with Gilda Crease, Latina Craver, MD.    Past Medical History:  Diagnosis Date   ASHD (arteriosclerotic heart disease)    CAD (coronary artery disease)    Diverticulitis    ED (erectile dysfunction)    Heart attack (HCC)    HTN (hypertension)    Mixed hyperlipidemia    Psoriasis     Past Surgical History:  Procedure Laterality Date   CORONARY ANGIOPLASTY     TONSILLECTOMY      Social History Social History   Tobacco Use   Smoking status: Former   Smokeless tobacco: Never   Tobacco comments:    quit over 50 years  Substance Use Topics   Alcohol use: Yes    Alcohol/week: 0.0 standard drinks of alcohol    Comment: rarely   Drug use: No    Family History Family History  Problem Relation Age of Onset   Stroke Mother    Lung cancer Father    Hearing loss Brother    Kidney disease Neg Hx    Prostate cancer Neg Hx     Allergies  Allergen Reactions   Penicillin G      REVIEW OF SYSTEMS  (Negative unless checked)  Constitutional: [] Weight loss  [] Fever  [] Chills Cardiac: [] Chest pain   [] Chest pressure   [] Palpitations   [] Shortness of breath when laying flat   [] Shortness of breath with exertion. Vascular:  [x] Pain in legs with walking   [] Pain in legs at rest  [] History of DVT   [] Phlebitis   [] Swelling in legs   [] Varicose veins   [] Non-healing ulcers Pulmonary:   [] Uses home oxygen   [] Productive cough   [] Hemoptysis   [] Wheeze  [] COPD   [] Asthma Neurologic:  [] Dizziness   [] Seizures   [] History of stroke   [] History of TIA  [] Aphasia   [] Vissual changes   [] Weakness or numbness in arm   [] Weakness or numbness in leg Musculoskeletal:   [] Joint swelling   [] Joint pain   [] Low back pain Hematologic:  [] Easy bruising  [] Easy bleeding   [] Hypercoagulable state   [] Anemic Gastrointestinal:  [] Diarrhea   [] Vomiting  [] Gastroesophageal reflux/heartburn   [] Difficulty swallowing. Genitourinary:  [] Chronic kidney disease   [] Difficult urination  [] Frequent urination   [] Blood in urine Skin:  [] Rashes   [] Ulcers  Psychological:  [] History of anxiety   []  History of major depression.  Physical Examination  There were no vitals filed for this visit. There is no height or  weight on file to calculate BMI. Gen: WD/WN, NAD Head: /AT, No temporalis wasting.  Ear/Nose/Throat: Hearing grossly intact, nares w/o erythema or drainage Eyes: PER, EOMI, sclera nonicteric.  Neck: Supple, no masses.  No bruit or JVD.  Pulmonary:  Good air movement, no audible wheezing, no use of accessory muscles.  Cardiac: RRR, normal S1, S2, no Murmurs. Vascular:  carotid bruit noted Vessel Right Left  Radial Palpable Palpable  Carotid  Palpable  Palpable  Subclav  Palpable Palpable  Gastrointestinal: soft, non-distended. No guarding/no peritoneal signs.  Musculoskeletal: M/S 5/5 throughout.  No visible deformity.  Neurologic: CN 2-12 intact. Pain and light touch intact in extremities.  Symmetrical.   Speech is fluent. Motor exam as listed above. Psychiatric: Judgment intact, Mood & affect appropriate for pt's clinical situation. Dermatologic: No rashes or ulcers noted.  No changes consistent with cellulitis.   CBC No results found for: "WBC", "HGB", "HCT", "MCV", "PLT"  BMET    Component Value Date/Time   NA 140 03/04/2014 0115   K 4.1 03/04/2014 0115   CL 104 03/04/2014 0115   CO2 29 03/04/2014 0115   GLUCOSE 102 (H) 03/04/2014 0115   BUN 16 03/04/2014 0115   CREATININE 1.29 03/04/2014 0115   CALCIUM 8.0 (L) 03/04/2014 0115   GFRNONAA 59 (L) 03/04/2014 0115   GFRAA >60 03/04/2014 0115   CrCl cannot be calculated (Patient's most recent lab result is older than the maximum 21 days allowed.).  COAG No results found for: "INR", "PROTIME"  Radiology No results found.   Assessment/Plan 1. Bilateral carotid artery stenosis Recommend:   Given the patient's asymptomatic subcritical stenosis no further invasive testing or surgery at this time.   Carotid Duplex done today shows RICA 60-79% and LICA <40%.   Continue antiplatelet therapy as prescribed Continue management of CAD, HTN and Hyperlipidemia Healthy heart diet,  encouraged exercise at least 4 times per week Follow up in 6 months with duplex ultrasound and physical exam - VAS US CAROTID; Future  2. Benign essential HTN Continue antihypertensive medications as already ordered, these medications have been reviewed and there are no changes at this time.   3. Atherosclerosis of native coronary artery of native heart without angina pectoris Continue cardiac and antihypertensive medications as already ordered and reviewed, no changes at this time.  Continue statin as ordered and reviewed, no changes at this time  Nitrates PRN for chest pain   4. Pure hypercholesterolemia Continue statin as ordered and reviewed, no changes at this time     Levora Dredge, MD  10/21/2021 3:51 PM

## 2021-10-22 ENCOUNTER — Encounter (INDEPENDENT_AMBULATORY_CARE_PROVIDER_SITE_OTHER): Payer: Self-pay | Admitting: Vascular Surgery

## 2022-04-28 ENCOUNTER — Other Ambulatory Visit (INDEPENDENT_AMBULATORY_CARE_PROVIDER_SITE_OTHER): Payer: Self-pay | Admitting: Nurse Practitioner

## 2022-04-28 DIAGNOSIS — I6523 Occlusion and stenosis of bilateral carotid arteries: Secondary | ICD-10-CM

## 2022-05-02 ENCOUNTER — Ambulatory Visit (INDEPENDENT_AMBULATORY_CARE_PROVIDER_SITE_OTHER): Payer: Medicare HMO

## 2022-05-02 ENCOUNTER — Encounter (INDEPENDENT_AMBULATORY_CARE_PROVIDER_SITE_OTHER): Payer: Self-pay | Admitting: Vascular Surgery

## 2022-05-02 ENCOUNTER — Ambulatory Visit (INDEPENDENT_AMBULATORY_CARE_PROVIDER_SITE_OTHER): Payer: Medicare HMO | Admitting: Vascular Surgery

## 2022-05-02 VITALS — BP 106/61 | HR 72 | Resp 18 | Ht 67.0 in | Wt 188.0 lb

## 2022-05-02 DIAGNOSIS — I10 Essential (primary) hypertension: Secondary | ICD-10-CM

## 2022-05-02 DIAGNOSIS — E7800 Pure hypercholesterolemia, unspecified: Secondary | ICD-10-CM | POA: Diagnosis not present

## 2022-05-02 DIAGNOSIS — I6523 Occlusion and stenosis of bilateral carotid arteries: Secondary | ICD-10-CM

## 2022-05-02 DIAGNOSIS — I2510 Atherosclerotic heart disease of native coronary artery without angina pectoris: Secondary | ICD-10-CM | POA: Diagnosis not present

## 2022-05-04 ENCOUNTER — Encounter (INDEPENDENT_AMBULATORY_CARE_PROVIDER_SITE_OTHER): Payer: Self-pay | Admitting: Vascular Surgery

## 2022-05-04 NOTE — Progress Notes (Signed)
MRN : [id number]  Daniel Miles is a 77 y.o. (January 09, 1946) male who presents with chief complaint of check carotid arteries.  History of Present Illness:  The patient is seen for follow up evaluation of carotid stenosis. The carotid stenosis followed by ultrasound.    The patient denies amaurosis fugax. There is no recent history of TIA symptoms or focal motor deficits. There is no prior documented CVA.   The patient is taking enteric-coated aspirin 81 mg daily.   There is no history of migraine headaches. There is no history of seizures.   The patient has a history of coronary artery disease, no recent episodes of angina or shortness of breath. The patient denies PAD or claudication symptoms. There is a history of hyperlipidemia which is being treated with a statin.     Carotid Duplex done 09/09/2021 shows RICA [id number] and LICA [id number].  No significant change compared to last study  Current Meds  Medication Sig   Ascorbic Acid (VITAMIN C) 1000 MG tablet Take 1,000 mg by mouth daily.   aspirin 325 MG tablet Take by mouth.   atorvastatin (LIPITOR) 40 MG tablet 40 mg.    Cholecalciferol (VITAMIN D3) 3000 units TABS Take by mouth.   clopidogrel (PLAVIX) 75 MG tablet Take by mouth.   glucosamine-chondroitin 500-400 MG tablet Take 1 tablet by mouth 3 (three) times daily.   lisinopril (ZESTRIL) 10 MG tablet Take 1 tablet by mouth daily.   metoprolol tartrate (LOPRESSOR) 25 MG tablet Take by mouth.   Multiple Vitamins-Minerals (SPECTRAVITE PO) Take by mouth.   nitroGLYCERIN (NITROSTAT) 0.4 MG SL tablet Place under the tongue.   Omega-3 Fatty Acids (FISH OIL PO) Take by mouth.   vitamin E 400 UNIT capsule Take 400 Units by mouth daily.    Past Medical History:  Diagnosis Date   ASHD (arteriosclerotic heart disease)    CAD (coronary artery disease)    Diverticulitis    ED (erectile dysfunction)    Heart attack ([place])    HTN (hypertension)    Mixed hyperlipidemia    Psoriasis      Past Surgical History:  Procedure Laterality Date   CORONARY ANGIOPLASTY     TONSILLECTOMY      Social History Social History   Tobacco Use   Smoking status: Former   Smokeless tobacco: Never   Tobacco comments:    quit over 50 years  Substance Use Topics   Alcohol use: Yes    Alcohol/week: 0.0 standard drinks of alcohol    Comment: rarely   Drug use: No    Family History Family History  Problem Relation Age of Onset   Stroke Mother    Lung cancer Father    Hearing loss Brother    Kidney disease Neg Hx    Prostate cancer Neg Hx     Allergies  Allergen Reactions   Penicillin G      REVIEW OF SYSTEMS (Negative unless checked)  Constitutional: []$ Weight loss  []$ Fever  []$ Chills Cardiac: []$ Chest pain   []$ Chest pressure   []$ Palpitations   []$ Shortness of breath when laying flat   []$ Shortness of breath with exertion. Vascular:  [x]$ Pain in legs with walking   []$ Pain in legs at rest  []$ History of DVT   []$ Phlebitis   []$ Swelling in legs   []$ Varicose veins   []$ Non-healing ulcers Pulmonary:   []$ Uses home oxygen   []$ Productive cough   []$ Hemoptysis   []$ Wheeze  []$ COPD   []$ Asthma Neurologic:  []$ Dizziness   []$   Seizures   []$ History of stroke   []$ History of TIA  []$ Aphasia   []$ Vissual changes   []$ Weakness or numbness in arm   []$ Weakness or numbness in leg Musculoskeletal:   []$ Joint swelling   []$ Joint pain   []$ Low back pain Hematologic:  []$ Easy bruising  []$ Easy bleeding   []$ Hypercoagulable state   []$ Anemic Gastrointestinal:  []$ Diarrhea   []$ Vomiting  [x]$ Gastroesophageal reflux/heartburn   []$ Difficulty swallowing. Genitourinary:  []$ Chronic kidney disease   []$ Difficult urination  []$ Frequent urination   []$ Blood in urine Skin:  []$ Rashes   []$ Ulcers  Psychological:  []$ History of anxiety   []$  History of major depression.  Physical Examination  Vitals:   05/02/22 1556  BP: 106/61  Pulse: 72  Resp: 18  Weight: 188 lb (85.3 kg)  Height: 5' 7"$  (1.702 m)   Body mass index is 29.44  kg/m. Gen: WD/WN, NAD Head: [place]/AT, No temporalis wasting.  Ear/Nose/Throat: Hearing grossly intact, nares w/o erythema or drainage Eyes: PER, EOMI, sclera nonicteric.  Neck: Supple, no masses.  No bruit or JVD.  Pulmonary:  Good air movement, no audible wheezing, no use of accessory muscles.  Cardiac: RRR, normal S1, S2, no Murmurs. Vascular:  carotid bruit noted Vessel Right Left  Radial Palpable Palpable  Carotid  Palpable  Palpable  Gastrointestinal: soft, non-distended. No guarding/no peritoneal signs.  Musculoskeletal: M/S 5/5 throughout.  No visible deformity.  Neurologic: CN 2-12 intact. Pain and light touch intact in extremities.  Symmetrical.  Speech is fluent. Motor exam as listed above. Psychiatric: Judgment intact, Mood & affect appropriate for pt's clinical situation. Dermatologic: No rashes or ulcers noted.  No changes consistent with cellulitis.   CBC No results found for: "WBC", "HGB", "HCT", "MCV", "PLT"  BMET    Component Value Date/Time   NA 140 03/04/2014 0115   K 4.1 03/04/2014 0115   CL 104 03/04/2014 0115   CO2 29 03/04/2014 0115   GLUCOSE 102 (H) 03/04/2014 0115   BUN 16 03/04/2014 0115   CREATININE 1.29 03/04/2014 0115   CALCIUM 8.0 (L) 03/04/2014 0115   GFRNONAA 59 (L) 03/04/2014 0115   GFRAA >60 03/04/2014 0115   CrCl cannot be calculated (Patient's most recent lab result is older than the maximum 21 days allowed.).  COAG No results found for: "INR", "PROTIME"  Radiology No results found.   Assessment/Plan 1. Bilateral carotid artery stenosis Recommend:  Given the patient's asymptomatic subcritical stenosis no further invasive testing or surgery at this time.  Duplex ultrasound shows 40-59% stenosis bilaterally.  Continue antiplatelet therapy as prescribed Continue management of CAD, HTN and Hyperlipidemia Healthy heart diet,  encouraged exercise at least 4 times per week Follow up in 6 months with duplex ultrasound and physical exam   - VAS US CAROTID; Future  2. Arteriosclerosis of coronary artery Continue cardiac and antihypertensive medications as already ordered and reviewed, no changes at this time.  Continue statin as ordered and reviewed, no changes at this time  Nitrates PRN for chest pain - Ambulatory referral to Cardiology  3. Benign essential HTN Continue antihypertensive medications as already ordered, these medications have been reviewed and there are no changes at this time.  4. Pure hypercholesterolemia Continue statin as ordered and reviewed, no changes at this time    [name], MD  05/04/2022 12:48 PM

## 2022-07-13 ENCOUNTER — Ambulatory Visit: Payer: Self-pay | Admitting: Cardiovascular Disease

## 2022-07-21 ENCOUNTER — Encounter: Payer: Self-pay | Admitting: Internal Medicine

## 2022-07-21 ENCOUNTER — Other Ambulatory Visit
Admission: RE | Admit: 2022-07-21 | Discharge: 2022-07-21 | Disposition: A | Discharge status: Home / Self Care | Payer: Medicare HMO | Source: Ambulatory Visit | Attending: Internal Medicine | Admitting: Internal Medicine

## 2022-07-21 ENCOUNTER — Ambulatory Visit: Payer: Medicare HMO | Attending: Cardiovascular Disease | Admitting: Internal Medicine

## 2022-07-21 VITALS — BP 134/70 | HR 69 | Ht 66.0 in | Wt 187.2 lb

## 2022-07-21 DIAGNOSIS — I6523 Occlusion and stenosis of bilateral carotid arteries: Secondary | ICD-10-CM | POA: Diagnosis not present

## 2022-07-21 DIAGNOSIS — E782 Mixed hyperlipidemia: Secondary | ICD-10-CM

## 2022-07-21 DIAGNOSIS — I10 Essential (primary) hypertension: Secondary | ICD-10-CM

## 2022-07-21 DIAGNOSIS — I25110 Atherosclerotic heart disease of native coronary artery with unstable angina pectoris: Secondary | ICD-10-CM

## 2022-07-21 DIAGNOSIS — R0789 Other chest pain: Secondary | ICD-10-CM | POA: Diagnosis present

## 2022-07-21 LAB — BASIC METABOLIC PANEL
Anion gap: 10 (ref 5–15)
BUN: 27 mg/dL — ABNORMAL HIGH (ref 8–23)
CO2: 26 mmol/L (ref 22–32)
Calcium: 9.4 mg/dL (ref 8.9–10.3)
Chloride: 100 mmol/L (ref 98–111)
Creatinine, Ser: 1.18 mg/dL (ref 0.61–1.24)
GFR, Estimated: >60 mL/min (ref >60)
Glucose, Bld: 97 mg/dL (ref 70–99)
Potassium: 4.1 mmol/L (ref 3.5–5.1)
Sodium: 136 mmol/L (ref 135–145)

## 2022-07-21 LAB — CBC
HCT: 43 % (ref 39.0–52.0)
Hemoglobin: 14.1 g/dL (ref 13.0–17.0)
MCH: 29.1 pg (ref 26.0–34.0)
MCHC: 32.8 g/dL (ref 30.0–36.0)
MCV: 88.7 fL (ref 80.0–100.0)
Platelets: 274 10*3/uL (ref 150–400)
RBC: 4.85 MIL/uL (ref 4.22–5.81)
RDW: 13.5 % (ref 11.5–15.5)
WBC: 11.3 10*3/uL — ABNORMAL HIGH (ref 4.0–10.5)
nRBC: 0 % (ref 0.0–0.2)

## 2022-07-21 MED ORDER — NITROGLYCERIN 0.4 MG SL SUBL: 0.4000 mg | SUBLINGUAL_TABLET | SUBLINGUAL | 3 refills | Status: AC | PRN

## 2022-07-21 MED ORDER — METOPROLOL SUCCINATE ER 25 MG PO TB24: 25.0000 mg | ORAL_TABLET | Freq: Every day | ORAL | 3 refills | Status: DC

## 2022-07-21 NOTE — Progress Notes (Signed)
 New Outpatient Visit Date: 07/21/2022  Referring Provider: Schnier, Gregory G, MD [address] [place],  [place] [address]  Chief Complaint: Chest pain  HPI:  Daniel Miles is a 76 y.o. male who is being seen today for the evaluation of chest pian at the request of Dr. Schnier. He has a history of coronary artery disease s/p PCI's, hypertension, hyperlipidemia, and carotid artery stenosis.  He was previously followed by Dr. Kowalski at [facility].  He presents today, concerned about recurrent chest pain yesterday.  It began while he was at work and is described as a sharp jab in the left side of his chest.  The pain would only last for few seconds at a time and would come and go all day.  When the pain came on, he would slow down or stop his activity altogether.  When he got home, he took a baby aspirin and has not had any further chest pain.  He notes having had some similar albeit less pronounced episodes over the last month or 2.  Pain is somewhat different than what he experienced prior to his PCI's, as it was more persistent and substernal.  Daniel Miles denies associated symptoms like shortness of breath, palpitations, lightheadedness, and edema.  He remains compliant with his medications including aspirin and clopidogrel.  He denies bleeding.  He notes that he ran out of metoprolol last fall and was unable to have it refilled following Dr. Kowalski's retirement.   --------------------------------------------------------------------------------------------------  Cardiovascular History & Procedures: Cardiovascular Problems: Coronary artery disease with multiple PCI's Carotid artery stenosis  Risk Factors: Known coronary artery and peripheral vascular disease, hypertension, hyperlipidemia, male gender, and remote tobacco use  Cath/PCI: LHC/PCI (2015): Occlusion of previously placed mid LAD stent treated with PCI.  Moderate LCx and RCA disease also noted.  CV Surgery: None  EP  Procedures and Devices: None  Non-Invasive Evaluation(s): Carotid Doppler (05/02/2022): 40-59% bilateral internal carotid artery stenoses.  Greater than 50% right external carotid artery stenosis.  Antegrade flow both vertebral arteries.  Normal flow hemodynamics in both subclavian arteries.  Recent CV Pertinent Labs: Lab Results  Component Value Date   K 4.1 03/04/2014   BUN 16 03/04/2014   CREATININE 1.29 03/04/2014    --------------------------------------------------------------------------------------------------  Past Medical History:  Diagnosis Date   ASHD (arteriosclerotic heart disease)    Bilateral carotid artery stenosis    CAD (coronary artery disease)    Diverticulitis    ED (erectile dysfunction)    Heart attack (HCC)    HTN (hypertension)    Mixed hyperlipidemia    Psoriasis    Psoriasis     Past Surgical History:  Procedure Laterality Date   CORONARY ANGIOPLASTY     TONSILLECTOMY      Current Meds  Medication Sig   Apoaequorin (PREVAGEN PO) Take 1 tablet by mouth daily.   Ascorbic Acid (VITAMIN C) 1000 MG tablet Take 1,000 mg by mouth daily.   aspirin EC 81 MG tablet Take 81 mg by mouth daily. Swallow whole.   atorvastatin (LIPITOR) 40 MG tablet Take 40 mg by mouth daily.   Cholecalciferol (VITAMIN D3) 50 MCG CAPS Take 1 capsule by mouth daily.   clopidogrel (PLAVIX) 75 MG tablet Take 75 mg by mouth daily.   glucosamine-chondroitin 500-400 MG tablet Take 1 tablet by mouth 3 (three) times daily.   isosorbide mononitrate (IMDUR) 30 MG 24 hr tablet Take 30 mg by mouth daily.   lisinopril (ZESTRIL) 10 MG tablet Take 1 tablet by   mouth daily.   nitroGLYCERIN (NITROSTAT) 0.4 MG SL tablet Place under the tongue.   Omega-3 Fatty Acids (FISH OIL PO) Take 1,200 mg by mouth daily.   Plant Sterols and Stanols (CHOLEST OFF PO) Take 1 tablet by mouth daily.   vitamin E 400 UNIT capsule Take 400 Units by mouth daily.    Allergies: Penicillin g  Social History    Tobacco Use   Smoking status: Former    Types: Cigarettes   Smokeless tobacco: Never   Tobacco comments:    quit over 50 years  Vaping Use   Vaping Use: Never used  Substance Use Topics   Alcohol use: Yes    Alcohol/week: 0.0 standard drinks of alcohol    Comment: rarely   Drug use: No    Family History  Problem Relation Age of Onset   Stroke Mother    Lung cancer Father    Hearing loss Brother    Kidney disease Neg Hx    Prostate cancer Neg Hx     Review of Systems: A 12-system review of systems was performed and was negative except as noted in the HPI.  --------------------------------------------------------------------------------------------------  Physical Exam: BP 134/70   Pulse 69   Ht 5' 6" (1.676 m)   Wt 187 lb 3.2 oz (84.9 kg)   SpO2 94%   BMI 30.21 kg/m   General: NAD. HEENT: No conjunctival pallor or scleral icterus. Neck: Supple without lymphadenopathy, thyromegaly, JVD, or HJR. No carotid bruit. Lungs: Normal work of breathing. Clear to auscultation bilaterally without wheezes or crackles. Heart: Regular rate and rhythm without murmurs, rubs, or gallops. Non-displaced PMI. Abd: Bowel sounds present. Soft, NT/ND without hepatosplenomegaly Ext: No lower extremity edema. Skin: Warm and dry without rash.  EKG: Possible ectopic atrial rhythm and nonspecific T wave abnormality.  Possible ectopic atrial rhythm is new since 03/03/2014.  Otherwise, there has been no significant overall change.  --------------------------------------------------------------------------------------------------  ASSESSMENT AND PLAN: Coronary artery disease with unstable angina: Ms. Miles has a history of significant CAD with most recent catheterization in 2015 showing occluded mid LAD stent that required reintervention as well as at least moderate LCx and RCA disease.  He notes new chest pain over the last few weeks, which has both typical and atypical features.  He  underwent MPI through [facility] in 08/2021, which showed reversible anterior and apical perfusion defect consistent with LAD territory ischemia.  Further evaluation was not performed at that time.  Given his abnormal stress test last year and accelerating symptoms, I have recommended that we move forward with cardiac catheterization and possible PCI.  We will restart metoprolol succinate 25 mg daily.  Daniel Miles should continue the remainder of his medications.  Sublingual nitroglycerin was also provided.  I advised him to call 911 if he has recurrent chest pain that does not resolve promptly with rest or nitroglycerin.  Carotid artery stenosis: Moderate bilateral ICA disease noted on last study.  Continue antiplatelet and lipid therapy.  Follow-up with Dr. Schnier as previously recommended.  Hypertension: Blood pressure borderline today.  Defer medication changes at this time.  Hyperlipidemia: Triglycerides and LDL mildly elevated on last check in 12/2021 (222 and 72, respectively).  Continue current doses of atorvastatin and omega-3 fatty acids for time being.  Add lipid panel onto a BMP and CBC drawn today.  Shared Decision Making/Informed Consent The risks [stroke (1 in 1000), death (1 in 1000), kidney failure [usually temporary] (1 in 500), bleeding (1 in 200), allergic reaction [  possibly serious] (1 in 200)], benefits (diagnostic support and management of coronary artery disease) and alternatives of a cardiac catheterization were discussed in detail with Daniel Miles and he is willing to proceed.   Follow-up: Return to clinic in 3 weeks.  Katey Barrie, MD 07/21/2022 4:13 PM  

## 2022-07-21 NOTE — Patient Instructions (Addendum)
Medication Instructions:  Your physician recommends the following medication changes.  START TAKING: Metoprolol Succinate 25 mg by mouth daily  For as needed Nitroglycerin, if you develop chest pain: Sit and rest 5 minutes. If chest pain does not resolve place 1 nitroglycerin under your tongue and wait 5 minutes. If chest pain does not resolve, place a 2nd nitroglycerin under your tongue and wait 5 more minutes. If chest pain does not resolve, place a 3rd nitroglycerin under your tongue and seek emergency services.    *If you need a refill on your cardiac medications before your next appointment, please call your pharmacy*   Lab Work: Your provider would like for you to have following labs drawn: (CBC, BMP).   Please go to the [facility] entrance and check in at the front desk.  You do not need an appointment.  They are open from 7am-6 pm.   If you have labs (blood work) drawn today and your tests are completely normal, you will receive your results only by: MyChart Message (if you have MyChart) OR A paper copy in the mail If you have any lab test that is abnormal or we need to change your treatment, we will call you to review the results.   Testing/Procedures: Your physician has requested that you have a cardiac catheterization. Cardiac catheterization is used to diagnose and/or treat various heart conditions. Doctors may recommend this procedure for a number of different reasons. The most common reason is to evaluate chest pain. Chest pain can be a symptom of coronary artery disease (CAD), and cardiac catheterization can show whether plaque is narrowing or blocking your heart's arteries. This procedure is also used to evaluate the valves, as well as measure the blood flow and oxygen levels in different parts of your heart. For further information please visit [email or web address] Please follow instruction sheet, as given. Please see instructions   Follow-Up: At [facility], you and your health needs are our priority.  As part of our continuing mission to provide you with exceptional heart care, we have created designated Provider Care Teams.  These Care Teams include your primary Cardiologist (physician) and Advanced Practice Providers (APPs -  Physician Assistants and Nurse Practitioners) who all work together to provide you with the care you need, when you need it.  We recommend signing up for the patient portal called "MyChart".  Sign up information is provided on this After Visit Summary.  MyChart is used to connect with patients for Virtual Visits (Telemedicine).  Patients are able to view lab/test results, encounter notes, upcoming appointments, etc.  Non-urgent messages can be sent to your provider as well.   To learn more about what you can do with MyChart, go to [email or web address].    Your next appointment:   3 week(s)  Provider:   You may see Yvonne Kendall, MD or one of the following Advanced Practice Providers on your designated Care Team:   Nicolasa Ducking, NP Eula Listen, PA-C Cadence Fransico Michael, PA-C Charlsie Quest, NP     [facility] A DEPT OF [place]. [facility] AT [facility] [address] [place] [address] Dept: [phone] Loc: [phone]  Daniel Miles  07/21/2022  You are scheduled for a Cardiac Catheterization on Wednesday, May 15 with Dr. Cristal Deer End.  1. Please arrive at the [facility] Entrance of [facility], [address], [place] [address] at 10:30 AM (This is 1 hour(s) prior to  your procedure time).  Proceed to the Check-In Desk directly inside the entrance.  Procedure Parking: Use the entrance off of the [address] side of the hospital. Turn right upon entering and follow the driveway to parking that is directly in front of the Heart [facility] Vascular Center. There is no valet parking available at this entrance, however there is  an awning directly in front of the Heart & Vascular Center for drop off/ pick up for patients.  Special note: Every effort is made to have your procedure done on time. Please understand that emergencies sometimes delay scheduled procedures.  2. Diet: Do not eat solid foods after midnight.  The patient may have clear liquids until 5am upon the day of the procedure.  3. Labs: You will need to have blood drawn today (CBC, BMP)  4. Medication instructions in preparation for your procedure:   Contrast Allergy: No  On the morning of your procedure, take your Aspirin 81 mg and Plavix/Clopidogrel and any morning medicines NOT listed above.  You may use sips of water.  5. Plan to go home the same day, you will only stay overnight if medically necessary. 6. Bring a current list of your medications and current insurance cards. 7. You MUST have a responsible person to drive you home. 8. Someone MUST be with you the first 24 hours after you arrive home or your discharge will be delayed. 9. Please wear clothes that are easy to get on and off and wear slip-on shoes.  Thank you for allowing [place] to care for you!   -- [facility]

## 2022-07-21 NOTE — H&P (View-Only) (Signed)
New Outpatient Visit Date: 07/21/2022  Referring Provider: Renford Dills, MD [address] [place],  [place] [address]  Chief Complaint: Chest pain  HPI:  Daniel Miles is a 77 y.o. male who is being seen today for the evaluation of chest pian at the request of Dr. Gilda Crease. He has a history of coronary artery disease s/p PCI's, hypertension, hyperlipidemia, and carotid artery stenosis.  He was previously followed by Dr. Gwen Pounds at [facility].  He presents today, concerned about recurrent chest pain yesterday.  It began while he was at work and is described as a sharp jab in the left side of his chest.  The pain would only last for few seconds at a time and would come and go all day.  When the pain came on, he would slow down or stop his activity altogether.  When he got home, he took a baby aspirin and has not had any further chest pain.  He notes having had some similar albeit less pronounced episodes over the last month or 2.  Pain is somewhat different than what he experienced prior to his PCI's, as it was more persistent and substernal.  Daniel Miles denies associated symptoms like shortness of breath, palpitations, lightheadedness, and edema.  He remains compliant with his medications including aspirin and clopidogrel.  He denies bleeding.  He notes that he ran out of metoprolol last fall and was unable to have it refilled following Dr. Philemon Kingdom retirement.   --------------------------------------------------------------------------------------------------  Cardiovascular History & Procedures: Cardiovascular Problems: Coronary artery disease with multiple PCI's Carotid artery stenosis  Risk Factors: Known coronary artery and peripheral vascular disease, hypertension, hyperlipidemia, male gender, and remote tobacco use  Cath/PCI: LHC/PCI (2015): Occlusion of previously placed mid LAD stent treated with PCI.  Moderate LCx and RCA disease also noted.  CV Surgery: None  EP  Procedures and Devices: None  Non-Invasive Evaluation(s): Carotid Doppler (05/02/2022): 40-59% bilateral internal carotid artery stenoses.  Greater than 50% right external carotid artery stenosis.  Antegrade flow both vertebral arteries.  Normal flow hemodynamics in both subclavian arteries.  Recent CV Pertinent Labs: Lab Results  Component Value Date   K 4.1 03/04/2014   BUN 16 03/04/2014   CREATININE 1.29 03/04/2014    --------------------------------------------------------------------------------------------------  Past Medical History:  Diagnosis Date   ASHD (arteriosclerotic heart disease)    Bilateral carotid artery stenosis    CAD (coronary artery disease)    Diverticulitis    ED (erectile dysfunction)    Heart attack (HCC)    HTN (hypertension)    Mixed hyperlipidemia    Psoriasis    Psoriasis     Past Surgical History:  Procedure Laterality Date   CORONARY ANGIOPLASTY     TONSILLECTOMY      Current Meds  Medication Sig   Apoaequorin (PREVAGEN PO) Take 1 tablet by mouth daily.   Ascorbic Acid (VITAMIN C) 1000 MG tablet Take 1,000 mg by mouth daily.   aspirin EC 81 MG tablet Take 81 mg by mouth daily. Swallow whole.   atorvastatin (LIPITOR) 40 MG tablet Take 40 mg by mouth daily.   Cholecalciferol (VITAMIN D3) 50 MCG CAPS Take 1 capsule by mouth daily.   clopidogrel (PLAVIX) 75 MG tablet Take 75 mg by mouth daily.   glucosamine-chondroitin 500-400 MG tablet Take 1 tablet by mouth 3 (three) times daily.   isosorbide mononitrate (IMDUR) 30 MG 24 hr tablet Take 30 mg by mouth daily.   lisinopril (ZESTRIL) 10 MG tablet Take 1 tablet by  mouth daily.   nitroGLYCERIN (NITROSTAT) 0.4 MG SL tablet Place under the tongue.   Omega-3 Fatty Acids (FISH OIL PO) Take 1,200 mg by mouth daily.   Plant Sterols and Stanols (CHOLEST OFF PO) Take 1 tablet by mouth daily.   vitamin E 400 UNIT capsule Take 400 Units by mouth daily.    Allergies: Penicillin g  Social History    Tobacco Use   Smoking status: Former    Types: Cigarettes   Smokeless tobacco: Never   Tobacco comments:    quit over 50 years  Vaping Use   Vaping Use: Never used  Substance Use Topics   Alcohol use: Yes    Alcohol/week: 0.0 standard drinks of alcohol    Comment: rarely   Drug use: No    Family History  Problem Relation Age of Onset   Stroke Mother    Lung cancer Father    Hearing loss Brother    Kidney disease Neg Hx    Prostate cancer Neg Hx     Review of Systems: A 12-system review of systems was performed and was negative except as noted in the HPI.  --------------------------------------------------------------------------------------------------  Physical Exam: BP 134/70   Pulse 69   Ht 5\' 6"  (1.676 m)   Wt 187 lb 3.2 oz (84.9 kg)   SpO2 94%   BMI 30.21 kg/m   General: NAD. HEENT: No conjunctival pallor or scleral icterus. Neck: Supple without lymphadenopathy, thyromegaly, JVD, or HJR. No carotid bruit. Lungs: Normal work of breathing. Clear to auscultation bilaterally without wheezes or crackles. Heart: Regular rate and rhythm without murmurs, rubs, or gallops. Non-displaced PMI. Abd: Bowel sounds present. Soft, NT/ND without hepatosplenomegaly Ext: No lower extremity edema. Skin: Warm and dry without rash.  EKG: Possible ectopic atrial rhythm and nonspecific T wave abnormality.  Possible ectopic atrial rhythm is new since 03/03/2014.  Otherwise, there has been no significant overall change.  --------------------------------------------------------------------------------------------------  ASSESSMENT AND PLAN: Coronary artery disease with unstable angina: Daniel Miles has a history of significant CAD with most recent catheterization in 2015 showing occluded mid LAD stent that required reintervention as well as at least moderate LCx and RCA disease.  He notes new chest pain over the last few weeks, which has both typical and atypical features.  He  underwent MPI through [facility] clinic in 08/2021, which showed reversible anterior and apical perfusion defect consistent with LAD territory ischemia.  Further evaluation was not performed at that time.  Given his abnormal stress test last year and accelerating symptoms, I have recommended that we move forward with cardiac catheterization and possible PCI.  We will restart metoprolol succinate 25 mg daily.  Daniel Miles should continue the remainder of his medications.  Sublingual nitroglycerin was also provided.  I advised him to call [phone] if he has recurrent chest pain that does not resolve promptly with rest or nitroglycerin.  Carotid artery stenosis: Moderate bilateral ICA disease noted on last study.  Continue antiplatelet and lipid therapy.  Follow-up with Dr. Gilda Crease as previously recommended.  Hypertension: Blood pressure borderline today.  Defer medication changes at this time.  Hyperlipidemia: Triglycerides and LDL mildly elevated on last check in 12/2021 (222 and 72, respectively).  Continue current doses of atorvastatin and omega-3 fatty acids for time being.  Add lipid panel onto a BMP and CBC drawn today.  Shared Decision Making/Informed Consent The risks [stroke (1 in 1000), death (1 in 1000), kidney failure [usually temporary] (1 in 500), bleeding (1 in 200), allergic reaction [  possibly serious] (1 in 200)], benefits (diagnostic support and management of coronary artery disease) and alternatives of a cardiac catheterization were discussed in detail with Daniel Miles and he is willing to proceed.   Follow-up: Return to clinic in 3 weeks.  Yvonne Kendall, MD 07/21/2022 4:13 PM

## 2022-07-22 ENCOUNTER — Other Ambulatory Visit: Payer: Self-pay

## 2022-07-22 DIAGNOSIS — E782 Mixed hyperlipidemia: Secondary | ICD-10-CM

## 2022-07-22 LAB — LIPID PANEL
Cholesterol: 161 mg/dL (ref 0–200)
HDL: 35 mg/dL — ABNORMAL LOW (ref >40)
LDL Cholesterol: 74 mg/dL (ref 0–99)
Total CHOL/HDL Ratio: 4.6 RATIO
Triglycerides: 261 mg/dL — ABNORMAL HIGH (ref <150)
VLDL: 52 mg/dL — ABNORMAL HIGH (ref 0–40)

## 2022-07-22 NOTE — Addendum Note (Signed)
Addended by: Parke Poisson on: 07/22/2022 07:46 AM   Modules accepted: Orders

## 2022-07-27 ENCOUNTER — Observation Stay
Admission: RE | Admit: 2022-07-27 | Discharge: 2022-07-28 | Disposition: A | Discharge status: Home / Self Care | Payer: Medicare HMO | Attending: Internal Medicine | Admitting: Internal Medicine

## 2022-07-27 ENCOUNTER — Encounter: Payer: Self-pay | Admitting: Internal Medicine

## 2022-07-27 ENCOUNTER — Other Ambulatory Visit: Payer: Self-pay

## 2022-07-27 ENCOUNTER — Encounter
Admission: RE | Disposition: A | Discharge status: Home / Self Care | Payer: Self-pay | Source: Home / Self Care | Attending: Internal Medicine

## 2022-07-27 DIAGNOSIS — Z87891 Personal history of nicotine dependence: Secondary | ICD-10-CM | POA: Diagnosis not present

## 2022-07-27 DIAGNOSIS — I10 Essential (primary) hypertension: Secondary | ICD-10-CM | POA: Insufficient documentation

## 2022-07-27 DIAGNOSIS — Z7902 Long term (current) use of antithrombotics/antiplatelets: Secondary | ICD-10-CM | POA: Insufficient documentation

## 2022-07-27 DIAGNOSIS — R079 Chest pain, unspecified: Secondary | ICD-10-CM

## 2022-07-27 DIAGNOSIS — R001 Bradycardia, unspecified: Secondary | ICD-10-CM | POA: Insufficient documentation

## 2022-07-27 DIAGNOSIS — I25110 Atherosclerotic heart disease of native coronary artery with unstable angina pectoris: Secondary | ICD-10-CM | POA: Diagnosis not present

## 2022-07-27 DIAGNOSIS — I25720 Atherosclerosis of autologous artery coronary artery bypass graft(s) with unstable angina pectoris: Secondary | ICD-10-CM | POA: Diagnosis present

## 2022-07-27 DIAGNOSIS — Z79899 Other long term (current) drug therapy: Secondary | ICD-10-CM | POA: Diagnosis not present

## 2022-07-27 DIAGNOSIS — I6523 Occlusion and stenosis of bilateral carotid arteries: Secondary | ICD-10-CM | POA: Diagnosis present

## 2022-07-27 DIAGNOSIS — Z7982 Long term (current) use of aspirin: Secondary | ICD-10-CM | POA: Insufficient documentation

## 2022-07-27 HISTORY — PX: CORONARY BALLOON ANGIOPLASTY: CATH118233

## 2022-07-27 HISTORY — PX: LEFT HEART CATH AND CORONARY ANGIOGRAPHY: CATH118249

## 2022-07-27 LAB — POCT ACTIVATED CLOTTING TIME: Activated Clotting Time: 304 seconds

## 2022-07-27 SURGERY — LEFT HEART CATH AND CORONARY ANGIOGRAPHY
Anesthesia: Moderate Sedation

## 2022-07-27 MED ORDER — METOPROLOL SUCCINATE ER 25 MG PO TB24
25.0000 mg | ORAL_TABLET | Freq: Every day | ORAL | Status: DC
  Administered 2022-07-28: 25 mg via ORAL
  Filled 2022-07-27: qty 1

## 2022-07-27 MED ORDER — ISOSORBIDE MONONITRATE ER 30 MG PO TB24
30.0000 mg | ORAL_TABLET | Freq: Every day | ORAL | Status: DC
  Administered 2022-07-27 – 2022-07-28 (×2): 30 mg via ORAL
  Filled 2022-07-27 (×2): qty 1

## 2022-07-27 MED ORDER — HEPARIN SODIUM (PORCINE) 1000 UNIT/ML IJ SOLN
INTRAMUSCULAR | Status: AC
  Filled 2022-07-27: qty 10

## 2022-07-27 MED ORDER — ENOXAPARIN SODIUM 40 MG/0.4ML IJ SOSY
40.0000 mg | PREFILLED_SYRINGE | INTRAMUSCULAR | Status: DC
  Administered 2022-07-28: 40 mg via SUBCUTANEOUS
  Filled 2022-07-27: qty 0.4

## 2022-07-27 MED ORDER — IOHEXOL 300 MG/ML  SOLN
INTRAMUSCULAR | Status: DC | PRN
  Administered 2022-07-27: 108 mL

## 2022-07-27 MED ORDER — SODIUM CHLORIDE 0.9 % IV SOLN: INTRAVENOUS | Status: AC

## 2022-07-27 MED ORDER — SODIUM CHLORIDE 0.9% FLUSH: 3.0000 mL | INTRAVENOUS | Status: DC | PRN

## 2022-07-27 MED ORDER — FENTANYL CITRATE (PF) 100 MCG/2ML IJ SOLN
INTRAMUSCULAR | Status: DC | PRN
  Administered 2022-07-27: 25 ug via INTRAVENOUS

## 2022-07-27 MED ORDER — NITROGLYCERIN 1 MG/10 ML FOR IR/CATH LAB
INTRA_ARTERIAL | Status: DC | PRN
  Administered 2022-07-27: 200 ug

## 2022-07-27 MED ORDER — HEPARIN (PORCINE) IN NACL 2000-0.9 UNIT/L-% IV SOLN
INTRAVENOUS | Status: DC | PRN
  Administered 2022-07-27: 1000 mL

## 2022-07-27 MED ORDER — VERAPAMIL HCL 2.5 MG/ML IV SOLN
INTRAVENOUS | Status: AC
  Filled 2022-07-27: qty 2

## 2022-07-27 MED ORDER — SODIUM CHLORIDE 0.9 % WEIGHT BASED INFUSION
3.0000 mL/kg/h | INTRAVENOUS | Status: DC
  Administered 2022-07-27: 3 mL/kg/h via INTRAVENOUS

## 2022-07-27 MED ORDER — HEPARIN (PORCINE) IN NACL 1000-0.9 UT/500ML-% IV SOLN
INTRAVENOUS | Status: AC
  Filled 2022-07-27: qty 1000

## 2022-07-27 MED ORDER — CLOPIDOGREL BISULFATE 75 MG PO TABS
ORAL_TABLET | ORAL | Status: AC
  Filled 2022-07-27: qty 4

## 2022-07-27 MED ORDER — CLOPIDOGREL BISULFATE 75 MG PO TABS
75.0000 mg | ORAL_TABLET | Freq: Every day | ORAL | Status: DC
  Administered 2022-07-28: 75 mg via ORAL
  Filled 2022-07-27: qty 1

## 2022-07-27 MED ORDER — MIDAZOLAM HCL 2 MG/2ML IJ SOLN
INTRAMUSCULAR | Status: AC
  Filled 2022-07-27: qty 2

## 2022-07-27 MED ORDER — CLOPIDOGREL BISULFATE 75 MG PO TABS
ORAL_TABLET | ORAL | Status: DC | PRN
  Administered 2022-07-27: 300 mg via ORAL

## 2022-07-27 MED ORDER — ATORVASTATIN CALCIUM 80 MG PO TABS
80.0000 mg | ORAL_TABLET | Freq: Every day | ORAL | Status: DC
  Administered 2022-07-28: 80 mg via ORAL
  Filled 2022-07-27: qty 1

## 2022-07-27 MED ORDER — HEPARIN SODIUM (PORCINE) 1000 UNIT/ML IJ SOLN
INTRAMUSCULAR | Status: DC | PRN
  Administered 2022-07-27: 4500 [IU] via INTRAVENOUS
  Administered 2022-07-27: 4000 [IU] via INTRAVENOUS

## 2022-07-27 MED ORDER — ACETAMINOPHEN 325 MG PO TABS: 650.0000 mg | ORAL_TABLET | ORAL | Status: DC | PRN

## 2022-07-27 MED ORDER — LIDOCAINE HCL 1 % IJ SOLN
INTRAMUSCULAR | Status: AC
  Filled 2022-07-27: qty 20

## 2022-07-27 MED ORDER — LISINOPRIL 10 MG PO TABS
10.0000 mg | ORAL_TABLET | Freq: Every day | ORAL | Status: DC
  Administered 2022-07-27 – 2022-07-28 (×2): 10 mg via ORAL
  Filled 2022-07-27: qty 1

## 2022-07-27 MED ORDER — ASPIRIN 81 MG PO TBEC
81.0000 mg | DELAYED_RELEASE_TABLET | Freq: Every day | ORAL | Status: DC
  Administered 2022-07-28: 81 mg via ORAL
  Filled 2022-07-27: qty 1

## 2022-07-27 MED ORDER — LABETALOL HCL 5 MG/ML IV SOLN: 10.0000 mg | INTRAVENOUS | Status: AC | PRN

## 2022-07-27 MED ORDER — ASPIRIN 81 MG PO CHEW
CHEWABLE_TABLET | ORAL | Status: AC
  Filled 2022-07-27: qty 1

## 2022-07-27 MED ORDER — FENTANYL CITRATE (PF) 100 MCG/2ML IJ SOLN
INTRAMUSCULAR | Status: AC
  Filled 2022-07-27: qty 2

## 2022-07-27 MED ORDER — NITROGLYCERIN 0.4 MG SL SUBL: 0.4000 mg | SUBLINGUAL_TABLET | SUBLINGUAL | Status: DC | PRN

## 2022-07-27 MED ORDER — VERAPAMIL HCL 2.5 MG/ML IV SOLN
INTRAVENOUS | Status: DC | PRN
  Administered 2022-07-27 (×2): 2.5 mg via INTRAVENOUS

## 2022-07-27 MED ORDER — ASPIRIN 81 MG PO CHEW
81.0000 mg | CHEWABLE_TABLET | ORAL | Status: AC
  Administered 2022-07-27: 81 mg via ORAL

## 2022-07-27 MED ORDER — MIDAZOLAM HCL 2 MG/2ML IJ SOLN
INTRAMUSCULAR | Status: DC | PRN
  Administered 2022-07-27: 1 mg via INTRAVENOUS

## 2022-07-27 MED ORDER — HYDRALAZINE HCL 20 MG/ML IJ SOLN: 10.0000 mg | INTRAMUSCULAR | Status: AC | PRN

## 2022-07-27 MED ORDER — SODIUM CHLORIDE 0.9 % IV SOLN: 250.0000 mL | INTRAVENOUS | Status: DC | PRN

## 2022-07-27 MED ORDER — ONDANSETRON HCL 4 MG/2ML IJ SOLN: 4.0000 mg | Freq: Four times a day (QID) | INTRAMUSCULAR | Status: DC | PRN

## 2022-07-27 MED ORDER — SODIUM CHLORIDE 0.9% FLUSH: 3.0000 mL | Freq: Two times a day (BID) | INTRAVENOUS | Status: DC

## 2022-07-27 MED ORDER — SODIUM CHLORIDE 0.9% FLUSH
3.0000 mL | Freq: Two times a day (BID) | INTRAVENOUS | Status: DC
  Administered 2022-07-27 – 2022-07-28 (×2): 3 mL via INTRAVENOUS

## 2022-07-27 MED ORDER — LISINOPRIL 10 MG PO TABS
ORAL_TABLET | ORAL | Status: AC
  Filled 2022-07-27: qty 2

## 2022-07-27 MED ORDER — SODIUM CHLORIDE 0.9 % WEIGHT BASED INFUSION: 1.0000 mL/kg/h | INTRAVENOUS | Status: DC

## 2022-07-27 SURGICAL SUPPLY — 18 items
BALLN MINITREK RX 2.0X12 (BALLOONS) ×2
BALLN SCOREFLEX 2.50X10 (BALLOONS) ×2
BALLOON MINITREK RX 2.0X12 (BALLOONS) IMPLANT
BALLOON SCOREFLEX 2.50X10 (BALLOONS) IMPLANT
CATH 5F 110X4 TIG (CATHETERS) IMPLANT
CATH INFINITI 5FR ANG PIGTAIL (CATHETERS) IMPLANT
CATH LAUNCHER 6FR EBU3.5 (CATHETERS) IMPLANT
DEVICE RAD TR BAND REGULAR (VASCULAR PRODUCTS) IMPLANT
DRAPE BRACHIAL (DRAPES) IMPLANT
GLIDESHEATH SLEND SS 6F .021 (SHEATH) IMPLANT
GUIDEWIRE INQWIRE 1.5J.035X260 (WIRE) IMPLANT
INQWIRE 1.5J .035X260CM (WIRE) ×2
KIT ENCORE 26 ADVANTAGE (KITS) IMPLANT
PACK CARDIAC CATH (CUSTOM PROCEDURE TRAY) ×2 IMPLANT
PROTECTION STATION PRESSURIZED (MISCELLANEOUS) ×2
SET ATX-X65L (MISCELLANEOUS) IMPLANT
STATION PROTECTION PRESSURIZED (MISCELLANEOUS) IMPLANT
WIRE RUNTHROUGH IZANAI 014 180 (WIRE) IMPLANT

## 2022-07-27 NOTE — Interval H&P Note (Signed)
History and Physical Interval Note:  07/27/2022 12:28 PM  Daniel Miles  has presented today for surgery, with the diagnosis of unstable angina.  The various methods of treatment have been discussed with the patient and family. After consideration of risks, benefits and other options for treatment, the patient has consented to  Procedure(s): LEFT HEART CATH AND CORONARY ANGIOGRAPHY (Left) as a surgical intervention.  The patient's history has been reviewed, patient examined, no change in status, stable for surgery.  I have reviewed the patient's chart and labs.  Questions were answered to the patient's satisfaction.    Cath Lab Visit (complete for each Cath Lab visit)  Clinical Evaluation Leading to the Procedure:   ACS: No.  Non-ACS:    Anginal Classification: CCS IV  Anti-ischemic medical therapy: Maximal Therapy (2 or more classes of medications)  Non-Invasive Test Results: Intermediate-risk stress test findings: cardiac mortality 1-3%/year  Prior CABG: No previous CABG  Daniel Miles

## 2022-07-27 NOTE — Brief Op Note (Signed)
BRIEF CARDIAC CATHETERIZATION NOTE  07/27/2022  1:33 PM  PATIENT:  Daniel Miles  77 y.o. male  PRE-OPERATIVE DIAGNOSIS:  L Calth    Chest pain  POST-OPERATIVE DIAGNOSIS:  * No post-op diagnosis entered *  PROCEDURE:  Procedure(s): LEFT HEART CATH AND CORONARY ANGIOGRAPHY (Left) CORONARY STENT INTERVENTION (N/A)  SURGEON:  Surgeon(s) and Role:    * Aldine Chakraborty, MD - Primary  FINDINGS: Significant 2-vessel CAD with 99% in-stent restenosis of mid LAD and 90% ostial rPDA lesion with left-to-right collaterals. Moderate LMCA, proximal LAD, and RCA disease also noted. Successful PTCA to mid LAD ISR with 10% residual stenosis and TIMI-3 flow.  RECOMMENDATIONS: Overnight observation. Continue DAPT with aspirin and clopidogrel for at least 1 month, ideally longer. Continue antianginal therapy for residual disease; ostial rPDA lesion is not well-suited for PCI. Aggressive secondary prevention of coronary artery disease.  Yvonne Kendall, MD [facility]

## 2022-07-28 ENCOUNTER — Encounter: Payer: Self-pay | Admitting: Internal Medicine

## 2022-07-28 DIAGNOSIS — I739 Peripheral vascular disease, unspecified: Secondary | ICD-10-CM

## 2022-07-28 DIAGNOSIS — R001 Bradycardia, unspecified: Secondary | ICD-10-CM | POA: Insufficient documentation

## 2022-07-28 DIAGNOSIS — R079 Chest pain, unspecified: Secondary | ICD-10-CM

## 2022-07-28 DIAGNOSIS — E785 Hyperlipidemia, unspecified: Secondary | ICD-10-CM | POA: Diagnosis not present

## 2022-07-28 DIAGNOSIS — I25110 Atherosclerotic heart disease of native coronary artery with unstable angina pectoris: Secondary | ICD-10-CM | POA: Diagnosis not present

## 2022-07-28 DIAGNOSIS — I25720 Atherosclerosis of autologous artery coronary artery bypass graft(s) with unstable angina pectoris: Secondary | ICD-10-CM | POA: Diagnosis not present

## 2022-07-28 LAB — BASIC METABOLIC PANEL
Anion gap: 7 (ref 5–15)
BUN: 24 mg/dL — ABNORMAL HIGH (ref 8–23)
CO2: 23 mmol/L (ref 22–32)
Calcium: 8.6 mg/dL — ABNORMAL LOW (ref 8.9–10.3)
Chloride: 105 mmol/L (ref 98–111)
Creatinine, Ser: 1.04 mg/dL (ref 0.61–1.24)
GFR, Estimated: >60 mL/min (ref >60)
Glucose, Bld: 92 mg/dL (ref 70–99)
Potassium: 4.5 mmol/L (ref 3.5–5.1)
Sodium: 135 mmol/L (ref 135–145)

## 2022-07-28 LAB — CBC
HCT: 43.6 % (ref 39.0–52.0)
Hemoglobin: 14.1 g/dL (ref 13.0–17.0)
MCH: 29 pg (ref 26.0–34.0)
MCHC: 32.3 g/dL (ref 30.0–36.0)
MCV: 89.5 fL (ref 80.0–100.0)
Platelets: 235 10*3/uL (ref 150–400)
RBC: 4.87 MIL/uL (ref 4.22–5.81)
RDW: 13.5 % (ref 11.5–15.5)
WBC: 10.7 10*3/uL — ABNORMAL HIGH (ref 4.0–10.5)
nRBC: 0 % (ref 0.0–0.2)

## 2022-07-28 MED ORDER — ATORVASTATIN CALCIUM 80 MG PO TABS: 80.0000 mg | ORAL_TABLET | Freq: Every day | ORAL | 1 refills | Status: DC

## 2022-07-28 MED ORDER — METOPROLOL SUCCINATE ER 25 MG PO TB24: 12.5000 mg | ORAL_TABLET | Freq: Every day | ORAL | 1 refills | Status: DC

## 2022-07-28 MED ORDER — METOPROLOL SUCCINATE ER 25 MG PO TB24: 12.5000 mg | ORAL_TABLET | Freq: Every day | ORAL | Status: DC

## 2022-07-28 NOTE — Discharge Instructions (Signed)
Please review your medications carefully as there have been some changes. You are now on a higher dose of cholesterol mediation (Lipitor 80 mg) and your metoprolol dose has been cut in half to 12.5 mg daily. Please call our office at [phone] if you have any questions for concerns.

## 2022-07-28 NOTE — Discharge Summary (Signed)
Discharge Summary    Patient ID: Daniel Miles  MRN: [id number], DOB/AGE: 1945-04-13 77 y.o.  Admit Date: 07/27/2022 Discharge Date: 07/28/2022  Primary Care Provider: Marina Goodell, MD Primary Cardiologist: Dr. Okey Dupre, MD  Discharge Diagnoses    Principal Problem:   Coronary artery disease involving native coronary artery of native heart with unstable angina pectoris [facility]) Active Problems:   Essential hypertension   Bilateral carotid artery stenosis   Sinus bradycardia   Allergies Allergies  Allergen Reactions   Penicillin G      History of Present Illness     77 year old male with history of CAD s/p PCI's, carotid artery disease with ultrasound in 04/2022 demonstrating 40-59% bilateral ICA stenosis, HTN, and HLD who was seen in the office as a new patient on 5/92/024 for evaluation of chest pain at the request of Dr. Gilda Crease with vascular surgery.   He was previously followed by Dr. Gwen Pounds with [facility] cardiology with [facility] in 2015 showing occlusion of previously placed mid LAD stent treated with PCI. There was moderate LCx and RCA disease noted. At time of his evaluation with Dr. Okey Dupre, the patient reported recurrent chest pain while at work that was described as a sharp jab in the left side of his chest that would last for a few seconds at a time and would be intermittent throughout the day over the prior couple of months. More recent episode prompting cardiology evaluation was more pronounced. Pain did not feel similar to his prior angina. There were no associated symptoms and he was compliant with his medications, including aspirin and Plavix. He had ran out of metoprolol, back in the fall of 2023, as he had not been able to get this refilled following the retirement of Dr. Gwen Pounds. He had most recently undergone MPI in 08/2021 with [facility] cardiology, that showed reversible anterior and apical perfusion defect consistent with LAD territory ischemia with not further  evaluation undertaken at that time. Given the patient's symptoms, and in the setting of his abnormal MPI, it was recommended the patient undergo LHC. He was restarted on metoprolol 25 mg daily as well.   Hospital Course     Consultants: Cardiac rehab   He presented as scheduled for diagnostic LHC on 07/27/2022, which demonstrated significant two-vessel CAD with 99% in-stent restenosis of the mid LAD with 90% ostial rPDA stenosis with left-to-right collaterals. There was moderate left main, proximal LAD, and RCA disease also noted as outlined below. Normal LVEDP at 12 mmHg. He underwent successful PTCA to the mid LAD in-stent restenosis with 10% residual stenosis and TIMI-3 flow. Post cath EKG, labs, and vitals are stable with an improving mild leukocytosis. He has ambulated without symptoms of angina or cardiac decompensation.    CAD involving the native coronary arteries with unstable angina: -Currently without angina or symptoms of cardiac decompensation  -Status post POBA to the mid LAD in-stent restenosis with 10% residual stenosis and TIMI-3 flow -Continue DAPT with ASA 81 mg daily and Plavix 75 mg daily -Now on titrated dose of Lipitor 80 mg -Reduce metoprolol to 12.5 mg daily given sinus bradycardia in the upper 40s to 60s bpm with first degree AV block -Continue PTA Imdur 30 mg and lisinopril 10 mg -Post cath instructions -Cardiac rehab  Carotid artery stenosis: -Moderate bilateral ICA disease noted on study in 04/2022 -Continue antiplatelet and lipid therapy as outlined above -Follow up with vascular surgery as outpatient as directed   HTN: -Blood pressure well controlled  -  Now on decreased dose of metoprolol 12.5 mg daily with continuation of lisinopril 10 mg and Imdur 30 mg -Low sodium diet recommended   HLD: -LDL 74 with triglyceride 261 earlier this month -LP(a) pending -Titrate Lipitor to 80 mg daily -Follow up fasting lipid panel and LFT in the office in 2 months with  recommendation to escalate lipid lowering therapy as indicated to achieve target LDL  Bradycardia with 1st degree AV block: -Reduce metoprolol to 12.5 mg daily -Follow up 12-lead EKG in the office  Leukocytosis: -No obvious infection -Likely inflammatory -Follow up CBC in the office    The patient's right radial arteriotomy site has been examined is healing well without issues at this time. The patient has been seen by Dr. Mariah Milling and felt to be stable for discharge today. All follow up appointments have been made. Discharge medications are listed below. Prescriptions have been reviewed with the patient and sent in to their pharmacy, as indicated.  _____________  Discharge Vitals Blood pressure 129/61, pulse 63, temperature (!) 97.3 F (36.3 C), resp. rate (!) 22, height 5\' 6"  (1.676 m), weight 83.9 kg, SpO2 95 %.  Filed Weights   07/27/22 1115  Weight: 83.9 kg   Physical Exam   GEN: No acute distress.   Neck: No JVD. Cardiac: RRR, no murmurs, rubs, or gallops. Right radial arteriotomy without active bleeding, swelling, bruising, erythema, warmth, or TTP. Radial pulse 2+ proximal and distal to the arteriotomy site.  Respiratory: Clear to auscultation bilaterally.  GI: Soft, nontender, non-distended.   MS: No edema; No deformity. Neuro:  Alert and oriented x 3; Nonfocal.  Psych: Normal affect.  Labs & Radiologic Studies    CBC Recent Labs    07/28/22 0503  WBC 10.7*  HGB 14.1  HCT 43.6  MCV 89.5  PLT 235   Basic Metabolic Panel Recent Labs    78/29/56 0503  NA 135  K 4.5  CL 105  CO2 23  GLUCOSE 92  BUN 24*  CREATININE 1.04  CALCIUM 8.6*   Liver Function Tests No results for input(s): "AST", "ALT", "ALKPHOS", "BILITOT", "PROT", "ALBUMIN" in the last 72 hours. No results for input(s): "LIPASE", "AMYLASE" in the last 72 hours. Cardiac Enzymes No results for input(s): "CKTOTAL", "CKMB", "CKMBINDEX", "TROPONINI" in the last 72 hours. BNP Invalid input(s):  "POCBNP" D-Dimer No results for input(s): "DDIMER" in the last 72 hours. Hemoglobin A1C No results for input(s): "HGBA1C" in the last 72 hours. Fasting Lipid Panel No results for input(s): "CHOL", "HDL", "LDLCALC", "TRIG", "CHOLHDL", "LDLDIRECT" in the last 72 hours. Thyroid Function Tests No results for input(s): "TSH", "T4TOTAL", "T3FREE", "THYROIDAB" in the last 72 hours.  Invalid input(s): "FREET3" _____________    Diagnostic Studies/Procedures   LHC 07/27/2022: Conclusions: Significant two-vessel coronary artery disease with 99% in-stent restenosis of mid LAD and 90% ostial rPDA lesion with left-to-right collaterals. Moderate LMCA, proximal LAD, and RCA disease also noted. Normal left ventricular filling pressure (LVEDP 12 mmHg). Successful PTCA to mid LAD in-stent restenosis with 10% residual stenosis and TIMI-3 flow.   Recommendations: Overnight observation. Continue dual antiplatelet therapy with aspirin and clopidogrel for at least 1 month, ideally longer. Continue antianginal therapy for residual disease; ostial rPDA lesion is not well-suited for PCI. Aggressive secondary prevention of coronary artery disease.    Diagnostic Dominance: Right Left Main  Vessel is large.  Dist LM to Ost LAD lesion is 40% stenosed. The lesion is eccentric.    Left Anterior Descending  Vessel is moderate in  size.  Mid LAD lesion is 99% stenosed. The lesion was previously treated over 2 years ago. Previously placed stent displays restenosis.    First Diagonal Branch  Vessel is moderate in size.  1st Diag lesion is 25% stenosed.    Left Circumflex  Vessel is moderate in size.  Prox Cx lesion is 25% stenosed.    First Obtuse Marginal Branch  Vessel is moderate in size.    Second Obtuse Marginal Branch  Vessel is small in size.    Third Obtuse Marginal Branch  Vessel is small in size.    Right Coronary Artery  Vessel is large. There is mild diffuse disease throughout the  vessel.  Dist RCA lesion is 50% stenosed with 90% stenosed side branch in RPDA.    Intervention   Mid LAD lesion  Angioplasty  Scoring balloon angioplasty was performed using a BALLN SCOREFLEX 2.50X10. Maximum pressure: 18 atm.  Post-Intervention Lesion Assessment  The intervention was successful. Pre-interventional TIMI flow is 2. Post-intervention TIMI flow is 3. No complications occurred at this lesion.  There is a 10% residual stenosis post intervention.     Wall Motion              Left Heart  Left Ventricle The left ventricular size is normal. The left ventricular systolic function is normal. LV end diastolic pressure is normal. LVEDP 12 mmHg. The left ventricular ejection fraction is 55-65% by visual estimate. No regional wall motion abnormalities.  Aortic Valve There is no aortic valve stenosis.   Coronary Diagrams  Diagnostic Dominance: Right  Intervention     _____________  Disposition   Pt is being discharged home today in good condition.  Follow-up Plans & Appointments     Follow-up Information     Sondra Barges, PA-C Follow up on 08/11/2022.   Specialties: Cardiology, Radiology Why: Appointment time 3:10 PM. Please arrive 20-30 minutes early. Contact information: [address] [place] [address] [phone]                Discharge Instructions     AMB Referral to Cardiac Rehabilitation - Phase II   Complete by: As directed    Diagnosis: PTCA   After initial evaluation and assessments completed: Virtual Based Care may be provided alone or in conjunction with Phase 2 Cardiac Rehab based on patient barriers.: Yes   Intensive Cardiac Rehabilitation (ICR) [facility] location only OR Traditional Cardiac Rehabilitation (TCR) *If criteria for ICR are not met will enroll in TCR [facility] only): Yes   Diet - low sodium heart healthy   Complete by: As directed    Increase activity slowly   Complete by: As directed        Discharge  Medications   Allergies as of 07/28/2022       Reactions   Penicillin G         Medication List     TAKE these medications    aspirin EC 81 MG tablet Take 81 mg by mouth daily. Swallow whole.   atorvastatin 80 MG tablet Commonly known as: LIPITOR Take 1 tablet (80 mg total) by mouth daily. Start taking on: Jul 29, 2022 What changed:  medication strength how much to take   CHOLEST OFF PO Take 1 tablet by mouth daily.   clopidogrel 75 MG tablet Commonly known as: PLAVIX Take 75 mg by mouth daily.   FISH OIL PO Take 1,200 mg by mouth daily.   glucosamine-chondroitin 500-400 MG tablet Take  1 tablet by mouth 3 (three) times daily.   isosorbide mononitrate 30 MG 24 hr tablet Commonly known as: IMDUR Take 30 mg by mouth daily.   lisinopril 10 MG tablet Commonly known as: ZESTRIL Take 1 tablet by mouth daily.   metoprolol succinate 25 MG 24 hr tablet Commonly known as: TOPROL-XL Take 0.5 tablets (12.5 mg total) by mouth daily. Start taking on: Jul 29, 2022 What changed:  how much to take additional instructions   nitroGLYCERIN 0.4 MG SL tablet Commonly known as: NITROSTAT Place 1 tablet (0.4 mg total) under the tongue every 5 (five) minutes as needed for chest pain.   PREVAGEN PO Take 1 tablet by mouth daily.   SPECTRAVITE PO Take by mouth.   vitamin C 1000 MG tablet Take 1,000 mg by mouth daily.   Vitamin D3 50 MCG Caps Take 1 capsule by mouth daily.   vitamin E 180 MG (400 UNITS) capsule Take 400 Units by mouth daily.          Aspirin prescribed at discharge?  Yes High Intensity Statin Prescribed? (Lipitor 40-80mg  or Crestor 20-40mg ): Yes Beta Blocker Prescribed? Yes For EF <40%, was ACEI/ARB Prescribed? No: EF > 40% ADP Receptor Inhibitor Prescribed? (i.e. Plavix etc.-Includes Medically Managed Patients): Yes For EF <40%, Aldosterone Inhibitor Prescribed? No: EF > 40% Was EF assessed during THIS hospitalization? Yes Was Cardiac Rehab II  ordered? (Included Medically managed Patients): Yes   Outstanding Labs/Studies   None.   Duration of Discharge Encounter   Greater than 30 minutes including physician time.  Signed, Sondra Barges, PA-C [facility] Pager: [phone] 07/28/2022, 11:36 AM

## 2022-07-29 LAB — LIPOPROTEIN A (LPA): Lipoprotein (a): 202 nmol/L — ABNORMAL HIGH (ref <75.0)

## 2022-08-10 NOTE — Progress Notes (Unsigned)
Cardiology Office Note    Date:  08/11/2022   ID:  Daniel, Miles 1946-02-26, MRN [id number]  PCP:  Marina Goodell, MD  Cardiologist:  Yvonne Kendall, MD  Electrophysiologist:  None   Chief Complaint: Follow-up  History of Present Illness:   Daniel Miles is a 77 y.o. male with history of CAD status post PCI's, carotid artery stenosis, HTN, and HLD who presents for follow-up of LHC.  He was previously followed by Dr. Gwen Pounds with [facility] cardiology.  LHC in 2015 showed occlusion of previously placed mid LAD stent treated with PCI with moderate LCx and RCA disease noted.  Carotid Doppler in 04/2022 showed 40 to 59% bilateral internal carotid artery stenoses, greater than 50% right external carotid artery stenosis, antegrade flow in the bilateral vertebral arteries, and normal flow hemodynamics in the bilateral subclavian arteries.  He underwent nuclear stress testing with [facility] cardiology in 08/2021 which showed reversible anterior and apical myocardial perfusion defect consistent with myocardial ischemia in the distribution of previously cleared LAD.  LVEF estimated at 51% at that time.  Echo at that time demonstrated an LVEF greater than 55%, normal wall motion and diastolic dysfunction, and trivial mitral regurgitation.  Further evaluation was not pursued at that time.  Upon establishing care with Dr. Okey Dupre on 07/21/2022, noting intermittent chest discomfort that would last for a few seconds at a time, though occur all day long.  Discomfort was similar to what he experienced prior to past PCI's.  Given symptoms, and in the setting of his abnormal stress test a year prior, he underwent LHC on 07/27/2022 that showed significant two-vessel CAD with 99% in-stent restenosis mid LAD and 90% ostial rPDA stenosis with left to right collaterals.  Moderate left main, proximal LAD, and RCA disease was also noted.  Normal LVEDP.  CAD with successful PTCA to mid LAD in-stent restenosis with  10% residual stenosis and TIMI-3 flow noted.  Atorvastatin was also titrated to 80 mg daily.  He comes in doing well from a cardiac perspective and is without symptoms of angina or cardiac decompensation.  No further chest pain since undergoing PTCA to in-stent restenosis of the LAD.  No dyspnea, palpitations, dizziness, presyncope, or syncope.  No lower extremity swelling, abdominal distention, or progressive orthopnea.  No falls, hematochezia, or melena.  He does notice some mild bruising of the right radial arteriotomy site, otherwise is without complications from his arteriotomy.  He has tolerated titration of atorvastatin.  Adherent to DAPT.   Labs independently reviewed: 07/2022 - LP(a) 202, Hgb 14.1, PLT 235, potassium 4.5, BUN 24, serum creatinine 1.04, TC 161, TG 261, HDL 35, LDL 74 12/2021 - albumin 4.3, AST/ALT normal, A1c 6.4 07/2015 - TSH normal  Past Medical History:  Diagnosis Date   ASHD (arteriosclerotic heart disease)    Bilateral carotid artery stenosis    CAD (coronary artery disease)    Diverticulitis    ED (erectile dysfunction)    Heart attack (HCC)    HTN (hypertension)    Mixed hyperlipidemia    Psoriasis    Psoriasis     Past Surgical History:  Procedure Laterality Date   CORONARY ANGIOPLASTY     CORONARY BALLOON ANGIOPLASTY N/A 07/27/2022   Procedure: CORONARY BALLOON ANGIOPLASTY;  Surgeon: Yvonne Kendall, MD;  Location: [facility] INVASIVE CV LAB;  Service: Cardiovascular;  Laterality: N/A;   LEFT HEART CATH AND CORONARY ANGIOGRAPHY Left 07/27/2022   Procedure: LEFT HEART CATH AND CORONARY ANGIOGRAPHY;  Surgeon: Yvonne Kendall,  MD;  Location: [facility] INVASIVE CV LAB;  Service: Cardiovascular;  Laterality: Left;   TONSILLECTOMY      Current Medications: Current Meds  Medication Sig   Apoaequorin (PREVAGEN PO) Take 1 tablet by mouth daily.   Ascorbic Acid (VITAMIN C) 1000 MG tablet Take 1,000 mg by mouth daily.   aspirin EC 81 MG tablet Take 81 mg by mouth  daily. Swallow whole.   atorvastatin (LIPITOR) 80 MG tablet Take 1 tablet (80 mg total) by mouth daily.   Cholecalciferol (VITAMIN D3) 50 MCG CAPS Take 1 capsule by mouth daily.   clopidogrel (PLAVIX) 75 MG tablet Take 75 mg by mouth daily.   glucosamine-chondroitin 500-400 MG tablet Take 1 tablet by mouth 3 (three) times daily.   isosorbide mononitrate (IMDUR) 30 MG 24 hr tablet Take 30 mg by mouth daily.   lisinopril (ZESTRIL) 10 MG tablet Take 1 tablet by mouth daily.   metoprolol succinate (TOPROL-XL) 25 MG 24 hr tablet Take 0.5 tablets (12.5 mg total) by mouth daily.   Multiple Vitamins-Minerals (SPECTRAVITE PO) Take by mouth.   Omega-3 Fatty Acids (FISH OIL PO) Take 1,200 mg by mouth daily.   Plant Sterols and Stanols (CHOLEST OFF PO) Take 1 tablet by mouth daily.   vitamin E 400 UNIT capsule Take 400 Units by mouth daily.    Allergies:   Penicillin g   Social History   Socioeconomic History   Marital status: Married    Spouse name: Not on file   Number of children: Not on file   Years of education: Not on file   Highest education level: Not on file  Occupational History   Not on file  Tobacco Use   Smoking status: Former    Types: Cigarettes   Smokeless tobacco: Never   Tobacco comments:    quit over 50 years  Vaping Use   Vaping Use: Never used  Substance and Sexual Activity   Alcohol use: Yes    Alcohol/week: 0.0 standard drinks of alcohol    Comment: rarely   Drug use: No   Sexual activity: Not Currently  Other Topics Concern   Not on file  Social History Narrative   Not on file   Social Determinants of Health   Financial Resource Strain: Not on file  Food Insecurity: Not on file  Transportation Needs: Not on file  Physical Activity: Not on file  Stress: Not on file  Social Connections: Not on file     Family History:  The patient's family history includes Hearing loss in his brother; Lung cancer in his father; Stroke in his mother. There is no  history of Kidney disease or Prostate cancer.  ROS:   12-point review of systems is negative unless otherwise noted in the HPI.   EKGs/Labs/Other Studies Reviewed:    Studies reviewed were summarized above. The additional studies were reviewed today:  LHC 07/27/2022: Conclusions: Significant two-vessel coronary artery disease with 99% in-stent restenosis of mid LAD and 90% ostial rPDA lesion with left-to-right collaterals. Moderate LMCA, proximal LAD, and RCA disease also noted. Normal left ventricular filling pressure (LVEDP 12 mmHg). Successful PTCA to mid LAD in-stent restenosis with 10% residual stenosis and TIMI-3 flow.   Recommendations: Overnight observation. Continue dual antiplatelet therapy with aspirin and clopidogrel for at least 1 month, ideally longer. Continue antianginal therapy for residual disease; ostial rPDA lesion is not well-suited for PCI. Aggressive secondary prevention of coronary artery disease. __________  Eugenie Birks MPI Gavin Potters) 08/19/2021: Indeterminate Lexiscan infusion EKG  due to baseline EKG changes and  nonspecific T wave changes  Reversible anterior and apical myocardial perfusion defect consistent with  myocardial ischemia which is in the distribution of his previously  occluded left anterior descending artery  Clinical correlation advised  __________  2D echo Jerline Pain) 08/19/2021: INTERPRETATION  NORMAL LEFT VENTRICULAR SYSTOLIC FUNCTION  NORMAL RIGHT VENTRICULAR SYSTOLIC FUNCTION  TRIVIAL REGURGITATION NOTED (See above)  NO VALVULAR STENOSIS  __________  See Epic for more remote studies   EKG:  EKG is ordered today.  The EKG ordered today demonstrates sinus bradycardia, 59 bpm, no acute ST-T changes  Recent Labs: 07/28/2022: BUN 24; Creatinine, Ser 1.04; Hemoglobin 14.1; Platelets 235; Potassium 4.5; Sodium 135  Recent Lipid Panel    Component Value Date/Time   CHOL 161 07/22/2022 1522   TRIG 261 (H) 07/22/2022 1522   HDL 35 (L)  07/22/2022 1522   CHOLHDL 4.6 07/22/2022 1522   VLDL 52 (H) 07/22/2022 1522   LDLCALC 74 07/22/2022 1522    PHYSICAL EXAM:    VS:  BP (!) 120/54 (BP Location: Left Arm, Patient Position: Sitting, Cuff Size: Normal)   Pulse (!) 59   Ht 5\' 6"  (1.676 m)   Wt 181 lb 6 oz (82.3 kg)   SpO2 95%   BMI 29.27 kg/m   BMI: Body mass index is 29.27 kg/m.  Physical Exam Vitals reviewed.  Constitutional:      Appearance: He is well-developed.  HENT:     Head: Normocephalic and atraumatic.  Eyes:     General:        Right eye: No discharge.        Left eye: No discharge.  Neck:     Vascular: No JVD.  Cardiovascular:     Rate and Rhythm: Normal rate and regular rhythm.     Heart sounds: Normal heart sounds, S1 normal and S2 normal. Heart sounds not distant. No midsystolic click and no opening snap. No murmur heard.    No friction rub.     Comments: Right radial arteriotomy site without active bleeding, swelling, warmth, erythema, or tenderness to palpation.  Resolving soft bruising along the medial last.  Radial pulse 2+ proximal and distal to the arteriotomy site. Pulmonary:     Effort: Pulmonary effort is normal. No respiratory distress.     Breath sounds: Normal breath sounds. No decreased breath sounds, wheezing or rales.  Chest:     Chest wall: No tenderness.  Abdominal:     General: There is no distension.  Musculoskeletal:     Cervical back: Normal range of motion.  Skin:    General: Skin is warm and dry.     Nails: There is no clubbing.  Neurological:     Mental Status: He is alert and oriented to person, place, and time.  Psychiatric:        Speech: Speech normal.        Behavior: Behavior normal.        Thought Content: Thought content normal.        Judgment: Judgment normal.     Wt Readings from Last 3 Encounters:  08/11/22 181 lb 6 oz (82.3 kg)  07/27/22 185 lb (83.9 kg)  07/21/22 187 lb 3.2 oz (84.9 kg)     ASSESSMENT & PLAN:   CAD involving the native  coronary arteries without angina: He is without symptoms of angina or cardiac decompensation following PTCA of in-stent restenosis to the LAD with residual nonobstructive and branch CAD as  outlined above.  Continue dual antiplatelet therapy with aspirin and clopidogrel for at least 1 month, ideally longer along with antianginal therapy for residual disease including Imdur and metoprolol with as needed SL NTG.  The ostial RPDA is not well-suited for PCI and has left to right collaterals.  Aggressive risk factor modification and secondary prevention.  Post cath instructions.  No indication for further ischemic testing at this time.  Carotid artery stenosis: Carotid Doppler in 04/2022 showed 40 to 59% bilateral internal carotid artery stenoses.  Remains on antiplatelet and lipid therapy.  Follow-up with vascular surgery as previously recommended.  HTN: Blood pressure is well-controlled in the office today.  He remains on Imdur, lisinopril, and Toprol-XL.  HLD with hypertriglyceridemia: LDL 74 with a triglyceride of 261 earlier this month.  Now on atorvastatin 80 mg.  Follow-up fasting lipid panel and LFT at next visit in 3 months with recommendation to escalate lipid-lowering therapy as indicated to achieve target LDL and triglyceride.    Disposition: F/u with Dr. Okey Dupre or an APP in 3 months.   Medication Adjustments/Labs and Tests Ordered: Current medicines are reviewed at length with the patient today.  Concerns regarding medicines are outlined above. Medication changes, Labs and Tests ordered today are summarized above and listed in the Patient Instructions accessible in Encounters.   Signed, Daniel Listen, PA-C 08/11/2022 4:46 PM     [facility] [place] [address] [place], [place] [address] [phone]

## 2022-08-11 ENCOUNTER — Encounter: Payer: Self-pay | Admitting: Physician Assistant

## 2022-08-11 ENCOUNTER — Ambulatory Visit: Payer: Medicare HMO | Attending: Physician Assistant | Admitting: Physician Assistant

## 2022-08-11 VITALS — BP 120/54 | HR 59 | Ht 66.0 in | Wt 181.4 lb

## 2022-08-11 DIAGNOSIS — I10 Essential (primary) hypertension: Secondary | ICD-10-CM

## 2022-08-11 DIAGNOSIS — I2510 Atherosclerotic heart disease of native coronary artery without angina pectoris: Secondary | ICD-10-CM | POA: Diagnosis not present

## 2022-08-11 DIAGNOSIS — Z79899 Other long term (current) drug therapy: Secondary | ICD-10-CM

## 2022-08-11 DIAGNOSIS — I6523 Occlusion and stenosis of bilateral carotid arteries: Secondary | ICD-10-CM

## 2022-08-11 DIAGNOSIS — E781 Pure hyperglyceridemia: Secondary | ICD-10-CM

## 2022-08-11 DIAGNOSIS — E785 Hyperlipidemia, unspecified: Secondary | ICD-10-CM

## 2022-08-11 NOTE — Patient Instructions (Signed)
Medication Instructions:  Your Physician recommend you continue on your current medication as directed.    *If you need a refill on your cardiac medications before your next appointment, please call your pharmacy*   Lab Work: Your provider would like for you to return to have the following labs drawn: (Lipid, LFT).   Please go to the [facility] entrance and check in at the front desk.  You do not need an appointment.  They are open from 7am-6 pm.  You will need to be fasting.  If you have labs (blood work) drawn today and your tests are completely normal, you will receive your results only by: MyChart Message (if you have MyChart) OR A paper copy in the mail If you have any lab test that is abnormal or we need to change your treatment, we will call you to review the results.   Testing/Procedures: None ordered today   Follow-Up: At [facility], you and your health needs are our priority.  As part of our continuing mission to provide you with exceptional heart care, we have created designated Provider Care Teams.  These Care Teams include your primary Cardiologist (physician) and Advanced Practice Providers (APPs -  Physician Assistants and Nurse Practitioners) who all work together to provide you with the care you need, when you need it.  We recommend signing up for the patient portal called "MyChart".  Sign up information is provided on this After Visit Summary.  MyChart is used to connect with patients for Virtual Visits (Telemedicine).  Patients are able to view lab/test results, encounter notes, upcoming appointments, etc.  Non-urgent messages can be sent to your provider as well.   To learn more about what you can do with MyChart, go to [email or web address].    Your next appointment:   3 month(s)  Provider:   You may see Yvonne Kendall, MD or one of the following Advanced Practice Providers on your designated Care Team:   Nicolasa Ducking, NP Eula Listen,  PA-C Cadence Fransico Michael, PA-C Charlsie Quest, NP

## 2022-09-12 ENCOUNTER — Ambulatory Visit: Payer: Self-pay | Admitting: Cardiovascular Disease

## 2022-10-31 ENCOUNTER — Ambulatory Visit (INDEPENDENT_AMBULATORY_CARE_PROVIDER_SITE_OTHER): Payer: Medicare HMO

## 2022-10-31 ENCOUNTER — Ambulatory Visit (INDEPENDENT_AMBULATORY_CARE_PROVIDER_SITE_OTHER): Payer: Medicare HMO | Admitting: Vascular Surgery

## 2022-10-31 ENCOUNTER — Encounter (INDEPENDENT_AMBULATORY_CARE_PROVIDER_SITE_OTHER): Payer: Self-pay | Admitting: Vascular Surgery

## 2022-10-31 VITALS — BP 150/74 | HR 60 | Resp 16 | Wt 164.4 lb

## 2022-10-31 DIAGNOSIS — I10 Essential (primary) hypertension: Secondary | ICD-10-CM | POA: Diagnosis not present

## 2022-10-31 DIAGNOSIS — I25110 Atherosclerotic heart disease of native coronary artery with unstable angina pectoris: Secondary | ICD-10-CM | POA: Diagnosis not present

## 2022-10-31 DIAGNOSIS — I6523 Occlusion and stenosis of bilateral carotid arteries: Secondary | ICD-10-CM

## 2022-10-31 DIAGNOSIS — E782 Mixed hyperlipidemia: Secondary | ICD-10-CM

## 2022-10-31 NOTE — Progress Notes (Signed)
MRN : [id number]  Daniel Miles is a 77 y.o. (1945/05/28) male who presents with chief complaint of check carotid arteries.  History of Present Illness:   The patient is seen for follow up evaluation of carotid stenosis. The carotid stenosis followed by ultrasound.    The patient denies amaurosis fugax. There is no recent history of TIA symptoms or focal motor deficits. There is no prior documented CVA.   The patient is taking enteric-coated aspirin 81 mg daily.   There is no history of migraine headaches. There is no history of seizures.   The patient has a history of coronary artery disease, no recent episodes of angina or shortness of breath. The patient denies PAD or claudication symptoms. There is a history of hyperlipidemia which is being treated with a statin.     Carotid Duplex done 10/31/2022 shows RICA 40-59% and LICA <40%.  No significant change compared to last study  Current Meds  Medication Sig   Apoaequorin (PREVAGEN PO) Take 1 tablet by mouth daily.   Ascorbic Acid (VITAMIN C) 1000 MG tablet Take 1,000 mg by mouth daily.   aspirin EC 81 MG tablet Take 81 mg by mouth daily. Swallow whole.   atorvastatin (LIPITOR) 80 MG tablet Take 1 tablet (80 mg total) by mouth daily.   Cholecalciferol (VITAMIN D3) 50 MCG CAPS Take 1 capsule by mouth daily.   clopidogrel (PLAVIX) 75 MG tablet Take 75 mg by mouth daily.   glucosamine-chondroitin 500-400 MG tablet Take 1 tablet by mouth 3 (three) times daily.   lisinopril (ZESTRIL) 10 MG tablet Take 1 tablet by mouth daily.   metoprolol succinate (TOPROL-XL) 25 MG 24 hr tablet Take 0.5 tablets (12.5 mg total) by mouth daily.   Multiple Vitamins-Minerals (SPECTRAVITE PO) Take by mouth.   Omega-3 Fatty Acids (FISH OIL PO) Take 1,200 mg by mouth daily.   Plant Sterols and Stanols (CHOLEST OFF PO) Take 1 tablet by mouth daily.   vitamin E 400 UNIT capsule Take 400 Units by mouth daily.    Past Medical  History:  Diagnosis Date   ASHD (arteriosclerotic heart disease)    Bilateral carotid artery stenosis    CAD (coronary artery disease)    Diverticulitis    ED (erectile dysfunction)    Heart attack (HCC)    HTN (hypertension)    Mixed hyperlipidemia    Psoriasis    Psoriasis     Past Surgical History:  Procedure Laterality Date   CORONARY ANGIOPLASTY     CORONARY BALLOON ANGIOPLASTY N/A 07/27/2022   Procedure: CORONARY BALLOON ANGIOPLASTY;  Surgeon: Yvonne Kendall, MD;  Location: [facility] INVASIVE CV LAB;  Service: Cardiovascular;  Laterality: N/A;   LEFT HEART CATH AND CORONARY ANGIOGRAPHY Left 07/27/2022   Procedure: LEFT HEART CATH AND CORONARY ANGIOGRAPHY;  Surgeon: Yvonne Kendall, MD;  Location: [facility] INVASIVE CV LAB;  Service: Cardiovascular;  Laterality: Left;   TONSILLECTOMY      Social History Social History   Tobacco Use   Smoking status: Former    Types: Cigarettes   Smokeless tobacco: Never   Tobacco comments:    quit over 50 years  Vaping Use   Vaping status: Never Used  Substance Use Topics   Alcohol use: Yes    Alcohol/week: 0.0 standard drinks of alcohol    Comment: rarely   Drug use: No  Family History Family History  Problem Relation Age of Onset   Stroke Mother    Lung cancer Father    Hearing loss Brother    Kidney disease Neg Hx    Prostate cancer Neg Hx     Allergies  Allergen Reactions   Penicillin G      REVIEW OF SYSTEMS (Negative unless checked)  Constitutional: [] Weight loss  [] Fever  [] Chills Cardiac: [] Chest pain   [] Chest pressure   [] Palpitations   [] Shortness of breath when laying flat   [] Shortness of breath with exertion. Vascular:  [x] Pain in legs with walking   [] Pain in legs at rest  [] History of DVT   [] Phlebitis   [] Swelling in legs   [] Varicose veins   [] Non-healing ulcers Pulmonary:   [] Uses home oxygen   [] Productive cough   [] Hemoptysis   [] Wheeze  [] COPD   [] Asthma Neurologic:  [] Dizziness   [] Seizures    [] History of stroke   [] History of TIA  [] Aphasia   [] Vissual changes   [] Weakness or numbness in arm   [] Weakness or numbness in leg Musculoskeletal:   [] Joint swelling   [] Joint pain   [] Low back pain Hematologic:  [] Easy bruising  [] Easy bleeding   [] Hypercoagulable state   [] Anemic Gastrointestinal:  [] Diarrhea   [] Vomiting  [x] Gastroesophageal reflux/heartburn   [] Difficulty swallowing. Genitourinary:  [] Chronic kidney disease   [] Difficult urination  [] Frequent urination   [] Blood in urine Skin:  [] Rashes   [] Ulcers  Psychological:  [] History of anxiety   []  History of major depression.  Physical Examination  Vitals:   10/31/22 1516  BP: (!) 150/74  Pulse: 60  Resp: 16  Weight: 164 lb 6.4 oz (74.6 kg)   Body mass index is 26.53 kg/m. Gen: WD/WN, NAD Head: [place]/AT, No temporalis wasting.  Ear/Nose/Throat: Hearing grossly intact, nares w/o erythema or drainage Eyes: PER, EOMI, sclera nonicteric.  Neck: Supple, no masses.  No bruit or JVD.  Pulmonary:  Good air movement, no audible wheezing, no use of accessory muscles.  Cardiac: RRR, normal S1, S2, no Murmurs. Vascular:  carotid bruit noted Vessel Right Left  Radial Palpable Palpable  Carotid  Palpable  Palpable  Gastrointestinal: soft, non-distended. No guarding/no peritoneal signs.  Musculoskeletal: M/S 5/5 throughout.  No visible deformity.  Neurologic: CN 2-12 intact. Pain and light touch intact in extremities.  Symmetrical.  Speech is fluent. Motor exam as listed above. Psychiatric: Judgment intact, Mood & affect appropriate for pt's clinical situation. Dermatologic: No rashes or ulcers noted.  No changes consistent with cellulitis.   CBC Lab Results  Component Value Date   WBC 10.7 (H) 07/28/2022   HGB 14.1 07/28/2022   HCT 43.6 07/28/2022   MCV 89.5 07/28/2022   PLT 235 07/28/2022    BMET    Component Value Date/Time   NA 135 07/28/2022 0503   NA 140 03/04/2014 0115   K 4.5 07/28/2022 0503   K 4.1  03/04/2014 0115   CL 105 07/28/2022 0503   CL 104 03/04/2014 0115   CO2 23 07/28/2022 0503   CO2 29 03/04/2014 0115   GLUCOSE 92 07/28/2022 0503   GLUCOSE 102 (H) 03/04/2014 0115   BUN 24 (H) 07/28/2022 0503   BUN 16 03/04/2014 0115   CREATININE 1.04 07/28/2022 0503   CREATININE 1.29 03/04/2014 0115   CALCIUM 8.6 (L) 07/28/2022 0503   CALCIUM 8.0 (L) 03/04/2014 0115   GFRNONAA >60 07/28/2022 0503   GFRNONAA 59 (L) 03/04/2014 0115   GFRAA >60 03/04/2014  0115   CrCl cannot be calculated (Patient's most recent lab result is older than the maximum 21 days allowed.).  COAG No results found for: "INR", "PROTIME"  Radiology No results found.   Assessment/Plan 1. Bilateral carotid artery stenosis Recommend:  Given the patient's asymptomatic subcritical stenosis no further invasive testing or surgery at this time.  Duplex ultrasound shows RICA 40-59% and 1-39% LICA stenosis bilaterally.  Continue antiplatelet therapy as prescribed Continue management of CAD, HTN and Hyperlipidemia Healthy heart diet,  encouraged exercise at least 4 times per week Follow up in 6 months with duplex ultrasound and physical exam  - VAS US CAROTID; Future  2. Essential hypertension Continue antihypertensive medications as already ordered, these medications have been reviewed and there are no changes at this time.  3. Coronary artery disease involving native coronary artery of native heart with unstable angina pectoris (HCC) Continue cardiac and antihypertensive medications as already ordered and reviewed, no changes at this time.  Continue statin as ordered and reviewed, no changes at this time  Nitrates PRN for chest pain  4. Mixed hyperlipidemia Continue statin as ordered and reviewed, no changes at this time    Levora Dredge, MD  10/31/2022 3:24 PM

## 2022-11-02 ENCOUNTER — Ambulatory Visit: Payer: Self-pay | Admitting: Internal Medicine

## 2022-11-06 ENCOUNTER — Encounter (INDEPENDENT_AMBULATORY_CARE_PROVIDER_SITE_OTHER): Payer: Self-pay | Admitting: Vascular Surgery

## 2022-11-16 ENCOUNTER — Encounter: Payer: Self-pay | Admitting: Internal Medicine

## 2022-11-16 ENCOUNTER — Ambulatory Visit: Payer: Medicare HMO | Attending: Internal Medicine | Admitting: Internal Medicine

## 2022-11-16 VITALS — BP 128/60 | HR 60 | Ht 67.0 in | Wt 173.2 lb

## 2022-11-16 DIAGNOSIS — I10 Essential (primary) hypertension: Secondary | ICD-10-CM

## 2022-11-16 DIAGNOSIS — I25110 Atherosclerotic heart disease of native coronary artery with unstable angina pectoris: Secondary | ICD-10-CM | POA: Diagnosis not present

## 2022-11-16 DIAGNOSIS — E782 Mixed hyperlipidemia: Secondary | ICD-10-CM

## 2022-11-16 DIAGNOSIS — I2510 Atherosclerotic heart disease of native coronary artery without angina pectoris: Secondary | ICD-10-CM

## 2022-11-16 DIAGNOSIS — E7800 Pure hypercholesterolemia, unspecified: Secondary | ICD-10-CM

## 2022-11-16 DIAGNOSIS — I259 Chronic ischemic heart disease, unspecified: Secondary | ICD-10-CM | POA: Diagnosis not present

## 2022-11-16 NOTE — Patient Instructions (Signed)
Medication Instructions:  Your physician recommends that you continue on your current medications as directed. Please refer to the Current Medication list given to you today.   *If you need a refill on your cardiac medications before your next appointment, please call your pharmacy*   Lab Work: Your provider would like for you to have following labs drawn today (Lipid, CMP).     Testing/Procedures: No test ordered today    Follow-Up: At [facility], you and your health needs are our priority.  As part of our continuing mission to provide you with exceptional heart care, we have created designated Provider Care Teams.  These Care Teams include your primary Cardiologist (physician) and Advanced Practice Providers (APPs -  Physician Assistants and Nurse Practitioners) who all work together to provide you with the care you need, when you need it.  We recommend signing up for the patient portal called "MyChart".  Sign up information is provided on this After Visit Summary.  MyChart is used to connect with patients for Virtual Visits (Telemedicine).  Patients are able to view lab/test results, encounter notes, upcoming appointments, etc.  Non-urgent messages can be sent to your provider as well.   To learn more about what you can do with MyChart, go to [email or web address].    Your next appointment:   3 month(s)  Provider:   You may see Yvonne Kendall, MD or one of the following Advanced Practice Providers on your designated Care Team:   Nicolasa Ducking, NP Eula Listen, PA-C Cadence Fransico Michael, PA-C Charlsie Quest, NP

## 2022-11-16 NOTE — Progress Notes (Signed)
Cardiology Office Note:  .   Date:  11/16/2022  ID:  Denyse Dago, DOB 16-Apr-1945, MRN [id number] PCP: Marina Goodell, MD  [facility] Providers Cardiologist:  Yvonne Kendall, MD     History of Present Illness: .   Daniel Miles is a 77 y.o. male with history of coronary artery disease status post PCI's, hypertension, hyperlipidemia, and carotid artery stenosis followed by vascular surgery, who presents for follow-up of coronary artery disease.  I met him in May, at which time he reported an episode of left-sided chest pain while at work.  Subsequent catheterization revealed significant two-vessel CAD with 99% in-stent restenosis of the mid LAD and 90% ostial RPDA lesion with left-to-right collaterals.  He underwent successful angioplasty of the mid LAD in-stent restenosis.  He was seen in follow-up in late May by Eula Listen, PA, at which time he denied further chest pain.  Today, Daniel Miles reports that he has continued to feel well without further chest pain.  He notes sporadic transient discomfort under the left armpit and radiating down the inside of his arm when he walks.  It does not happen regularly and resolves when he stops for just a moment.  It is not reminiscent of what he experienced prior to his recent PTCA to the LAD.  He has not had any shortness of breath, palpitations, lightheadedness, or edema.  He is tolerating his medications well.  ROS: See HPI  Studies Reviewed: Marland Kitchen   EKG Interpretation Date/Time:  Wednesday November 16 2022 10:09:01 EDT Ventricular Rate:  60 PR Interval:  186 QRS Duration:  88 QT Interval:  376 QTC Calculation: 376 R Axis:   69  Text Interpretation: Normal sinus rhythm Nonspecific T wave abnormality Abnormal ECG When compared with ECG of 11-Aug-2022 No significant change was found Confirmed by Aquila Delaughter ([id number]) on 11/16/2022 1:25:41 PM    LHC/PCI (07/27/2022): LMCA with 40% distal stenosis.  LAD with 99% focal in-stent  restenosis of the mid LAD.  25% stenosis at ostium of jailed D1 also present.  25% mid LCx lesion noted.  RCA with mild diffuse disease, 50% distal stenosis and 90% ostial rPDA lesion.  Risk Assessment/Calculations:             Physical Exam:   VS:  BP 128/60 (BP Location: Left Arm, Patient Position: Sitting, Cuff Size: Normal)   Pulse 60   Ht 5\' 7"  (1.702 m)   Wt 173 lb 4 oz (78.6 kg)   SpO2 98%   BMI 27.13 kg/m    Wt Readings from Last 3 Encounters:  11/16/22 173 lb 4 oz (78.6 kg)  10/31/22 164 lb 6.4 oz (74.6 kg)  08/11/22 181 lb 6 oz (82.3 kg)    General:  NAD. Neck: No JVD or HJR. Lungs: Clear to auscultation bilaterally without wheezes or crackles. Heart: Regular rate and rhythm without murmurs, rubs, or gallops. Abdomen: Soft, nontender, nondistended. Extremities: No lower extremity edema.  ASSESSMENT AND PLAN: .    Coronary artery disease with stable angina and hyperlipidemia: Overall, Daniel Miles is feeling well though he has sporadic episodes of transient discomfort under the left axilla radiating to the left elbow.  It is different than what he experienced prior to his PTCA of in-stent restenosis of the LAD earlier this year.  Pain could be noncardiac in nature or related to ostial RPDA disease that is not well-suited to PCI.  We discussed continued conservative management versus further evaluation with myocardial perfusion stress test  and have agreed to defer testing unless pain becomes more frequent/severe.  We will plan to continue at least 6 months of DAPT from the time of his PTCA in May, as well as aggressive secondary prevention with atorvastatin 80 mg daily.  I will check a lipid panel and CMP today to ensure that his LDL is at goal.  We may also need to consider de-escalation of statin therapy and addition of a PCSK9 inhibitor given elevated LP(a).  However, I will defer this until after reviewing his most recent lipid panel.  Continue isosorbide mononitrate and  low-dose metoprolol succinate for antianginal therapy.  Hypertension: Blood pressure well-controlled today.  Continue current medications.    Dispo: Return to clinic in 3 months.  Signed, Yvonne Kendall, MD

## 2022-11-17 LAB — COMPREHENSIVE METABOLIC PANEL
ALT: 17 IU/L (ref 0–44)
AST: 17 IU/L (ref 0–40)
Albumin: 4.4 g/dL (ref 3.8–4.8)
Alkaline Phosphatase: 72 IU/L (ref 44–121)
BUN/Creatinine Ratio: 16 (ref 10–24)
BUN: 17 mg/dL (ref 8–27)
Bilirubin Total: 0.7 mg/dL (ref 0.0–1.2)
CO2: 24 mmol/L (ref 20–29)
Calcium: 9.1 mg/dL (ref 8.6–10.2)
Chloride: 103 mmol/L (ref 96–106)
Creatinine, Ser: 1.05 mg/dL (ref 0.76–1.27)
Globulin, Total: 2.5 g/dL (ref 1.5–4.5)
Glucose: 105 mg/dL — ABNORMAL HIGH (ref 70–99)
Potassium: 4.1 mmol/L (ref 3.5–5.2)
Sodium: 142 mmol/L (ref 134–144)
Total Protein: 6.9 g/dL (ref 6.0–8.5)
eGFR: 73 mL/min/{1.73_m2} (ref >59)

## 2022-11-17 LAB — LIPID PANEL
Chol/HDL Ratio: 3.7 ratio (ref 0.0–5.0)
Cholesterol, Total: 127 mg/dL (ref 100–199)
HDL: 34 mg/dL — ABNORMAL LOW (ref >39)
LDL Chol Calc (NIH): 67 mg/dL (ref 0–99)
Triglycerides: 149 mg/dL (ref 0–149)
VLDL Cholesterol Cal: 26 mg/dL (ref 5–40)

## 2023-01-13 ENCOUNTER — Other Ambulatory Visit: Payer: Self-pay | Admitting: Physician Assistant

## 2023-02-15 ENCOUNTER — Ambulatory Visit: Payer: Medicare HMO | Admitting: Physician Assistant

## 2023-02-23 ENCOUNTER — Ambulatory Visit: Payer: Medicare HMO | Admitting: Internal Medicine

## 2023-02-23 NOTE — Progress Notes (Unsigned)
  Cardiology Office Note:  .   Date:  02/23/2023  ID:  Daniel Miles, DOB 10-24-1945, MRN [id number] PCP: Marina Goodell, MD  [facility] Providers Cardiologist:  Yvonne Kendall, MD { Click to update primary MD,subspecialty MD or APP then REFRESH:1}    History of Present Illness: .   Daniel Miles is a 77 y.o. male history of coronary artery disease status post multiple PCI's, hypertension, hyperlipidemia, and carotid artery stenosis followed by vascular surgery, who presents for follow-up of CAD.  I met him in May, at which time he reported an episode of left-sided chest pain while at work. Subsequent catheterization revealed significant two-vessel CAD with 99% in-stent restenosis of the mid LAD and 90% ostial RPDA lesion with left-to-right collaterals. He underwent successful angioplasty of the mid LAD in-stent restenosis.  I last saw him in September, at which time he was feeling well without recurrent chest pain though he noted some sporadic discomfort under the left axilla radiating along the inside of his arm when walking.  This typically only lasted a few seconds and was not reminiscent of his prior angina.  Consider de-escalation of statin and initiation of PCSK9 inhibitor with elevated LP(a).  ROS: See HPI  Studies Reviewed: Marland Kitchen        LHC/PCI (07/27/2022): LMCA with 40% distal stenosis.  LAD with 99% focal in-stent restenosis of the mid LAD.  25% stenosis at ostium of jailed D1 also present.  25% mid LCx lesion noted.  RCA with mild diffuse disease, 50% distal stenosis and 90% ostial rPDA lesion.   Risk Assessment/Calculations:   {Does this patient have ATRIAL FIBRILLATION?[phone]} No BP recorded.  {Refresh Note OR Click here to enter BP  :1}***       Physical Exam:   VS:  There were no vitals taken for this visit.   Wt Readings from Last 3 Encounters:  11/16/22 173 lb 4 oz (78.6 kg)  10/31/22 164 lb 6.4 oz (74.6 kg)  08/11/22 181 lb 6 oz (82.3 kg)     General:  NAD. Neck: No JVD or HJR. Lungs: Clear to auscultation bilaterally without wheezes or crackles. Heart: Regular rate and rhythm without murmurs, rubs, or gallops. Abdomen: Soft, nontender, nondistended. Extremities: No lower extremity edema.  ASSESSMENT AND PLAN: .    ***    {Are you ordering a CV Procedure (e.g. stress test, cath, DCCV, TEE, etc)?   Press F2        :[id number]}  Dispo: ***  Signed, Yvonne Kendall, MD

## 2023-04-18 ENCOUNTER — Other Ambulatory Visit: Payer: Self-pay | Admitting: Physician Assistant

## 2023-04-18 NOTE — Telephone Encounter (Signed)
Good Morning,   Could you schedule this patient an over due 3 month follow up visit? The patient was last seen by Dr. Okey Dupre on 11-16-2022. Thank you so much.

## 2023-04-26 NOTE — Progress Notes (Unsigned)
Cardiology Office Note    Date:  04/27/2023   ID:  Daniel, Miles 03-16-1945, MRN [id number]  PCP:  Marina Goodell, MD  Cardiologist:  Yvonne Kendall, MD  Electrophysiologist:  None   Chief Complaint: Follow-up  History of Present Illness:   Daniel Miles is a 78 y.o. male with history of CAD status post PCI's, carotid artery stenosis, HTN, and HLD who presents for follow-up of CAD.   He was previously followed by Dr. Gwen Pounds with [facility] cardiology.  LHC in 2015 showed occlusion of previously placed mid LAD stent treated with PCI with moderate LCx and RCA disease noted.  He underwent nuclear stress testing with [facility] cardiology in 08/2021 which showed reversible anterior and apical myocardial perfusion defect consistent with myocardial ischemia in the distribution of previously treated LAD.  LVEF estimated at 51%.  Echo at that time demonstrated an LVEF greater than 55%, normal wall motion and diastolic dysfunction, and trivial mitral regurgitation.  Further evaluation was not pursued.  Carotid Doppler in 04/2022 showed 40 to 59% bilateral ICA stenoses, greater than 50% right external carotid artery stenosis, antegrade flow in the bilateral vertebral arteries, and normal flow hemodynamics in the bilateral subclavian arteries   Upon establishing care with Dr. Okey Dupre on 07/21/2022, he noted intermittent chest discomfort that would last for a few seconds at a time, though occur intermittently all day long.  Discomfort was similar to what he experienced prior to past PCIs.  Given symptoms, and in the setting of his abnormal stress test a year prior, he underwent LHC on 07/27/2022 that showed significant two-vessel CAD with 99% in-stent restenosis mid LAD and 90% ostial rPDA stenosis with left-to-right collaterals.  Moderate left main, proximal LAD, and RCA disease was also noted.  Normal LVEDP.  He underwent successful PTCA to mid LAD in-stent restenosis with 10% residual stenosis  and TIMI-3 flow noted.  He was most recently seen in the office on 11/16/2022 and continued to feel well, without further chest pain.  He noted sporadic transient discomfort under the left axilla that radiated down the inside of his arm when he walked.  This was happening infrequently and not similar to prior angina.  Continued pharmacotherapy was pursued.  He comes in doing very well from a cardiac perspective and is without symptoms of angina or cardiac decompensation.  No further left axillary discomfort.  No dizziness, presyncope, or syncope.  No falls, hematochezia, or melena.  Adherent to cardiac medications without off target effect.  No lower extremity swelling or progressive orthopnea.  Has not yet taken medications this morning.  Has follow-up with vascular surgery for carotid artery disease later this month.  Working on improving his diet.  Eating smaller portion sizes.  In this setting, his weight is down 6 pounds today when compared to his visit in 11/2022.  He reports this is intentional.  Overall feels well from a cardiac perspective and does not have any acute cardiac concerns at this time.   Labs independently reviewed: 02/2023 - Hgb 13.9, PLT 205, potassium 5, BUN 19, serum creatinine 1.2, albumin 4.2, AST/ALT normal, A1c 5.9 11/2022 - TC 127, TG 149, HDL 34, LDL 67 07/2015 - TSH normal   Past Medical History:  Diagnosis Date   ASHD (arteriosclerotic heart disease)    Bilateral carotid artery stenosis    CAD (coronary artery disease)    Diverticulitis    ED (erectile dysfunction)    Heart attack (HCC)    HTN (hypertension)  Mixed hyperlipidemia    Psoriasis    Psoriasis     Past Surgical History:  Procedure Laterality Date   CORONARY ANGIOPLASTY     CORONARY BALLOON ANGIOPLASTY N/A 07/27/2022   Procedure: CORONARY BALLOON ANGIOPLASTY;  Surgeon: Yvonne Kendall, MD;  Location: [facility] INVASIVE CV LAB;  Service: Cardiovascular;  Laterality: N/A;   LEFT HEART CATH AND CORONARY  ANGIOGRAPHY Left 07/27/2022   Procedure: LEFT HEART CATH AND CORONARY ANGIOGRAPHY;  Surgeon: Yvonne Kendall, MD;  Location: [facility] INVASIVE CV LAB;  Service: Cardiovascular;  Laterality: Left;   TONSILLECTOMY      Current Medications: No outpatient medications have been marked as taking for the 04/27/23 encounter (Office Visit) with Sondra Barges, PA-C.    Allergies:   Penicillin g   Social History   Socioeconomic History   Marital status: Married    Spouse name: Not on file   Number of children: Not on file   Years of education: Not on file   Highest education level: Not on file  Occupational History   Not on file  Tobacco Use   Smoking status: Former    Types: Cigarettes   Smokeless tobacco: Never   Tobacco comments:    quit over 50 years  Vaping Use   Vaping status: Never Used  Substance and Sexual Activity   Alcohol use: Yes    Alcohol/week: 0.0 standard drinks of alcohol    Comment: rarely   Drug use: No   Sexual activity: Not Currently  Other Topics Concern   Not on file  Social History Narrative   Not on file   Social Drivers of Health   Financial Resource Strain: Not on file  Food Insecurity: Not on file  Transportation Needs: Not on file  Physical Activity: Not on file  Stress: Not on file  Social Connections: Not on file     Family History:  The patient's family history includes Hearing loss in his brother; Lung cancer in his father; Stroke in his mother. There is no history of Kidney disease or Prostate cancer.  ROS:   12-point review of systems is negative unless otherwise noted in the HPI.   EKGs/Labs/Other Studies Reviewed:    Studies reviewed were summarized above. The additional studies were reviewed today:  LHC 07/27/2022: Conclusions: Significant two-vessel coronary artery disease with 99% in-stent restenosis of mid LAD and 90% ostial rPDA lesion with left-to-right collaterals. Moderate LMCA, proximal LAD, and RCA disease also  noted. Normal left ventricular filling pressure (LVEDP 12 mmHg). Successful PTCA to mid LAD in-stent restenosis with 10% residual stenosis and TIMI-3 flow.   Recommendations: Overnight observation. Continue dual antiplatelet therapy with aspirin and clopidogrel for at least 1 month, ideally longer. Continue antianginal therapy for residual disease; ostial rPDA lesion is not well-suited for PCI. Aggressive secondary prevention of coronary artery disease. __________   Eugenie Birks MPI Gavin Potters) 08/19/2021: Indeterminate Lexiscan infusion EKG due to baseline EKG changes and  nonspecific T wave changes  Reversible anterior and apical myocardial perfusion defect consistent with  myocardial ischemia which is in the distribution of his previously  occluded left anterior descending artery  Clinical correlation advised  __________   2D echo Jerline Pain) 08/19/2021: INTERPRETATION  NORMAL LEFT VENTRICULAR SYSTOLIC FUNCTION  NORMAL RIGHT VENTRICULAR SYSTOLIC FUNCTION  TRIVIAL REGURGITATION NOTED (See above)  NO VALVULAR STENOSIS  __________   See Epic for more remote studies   EKG:  EKG is not ordered today.    Recent Labs: 07/28/2022: Hemoglobin 14.1; Platelets 235 11/16/2022:  ALT 17; BUN 17; Creatinine, Ser 1.05; Potassium 4.1; Sodium 142  Recent Lipid Panel    Component Value Date/Time   CHOL 127 11/16/2022 1027   TRIG 149 11/16/2022 1027   HDL 34 (L) 11/16/2022 1027   CHOLHDL 3.7 11/16/2022 1027   CHOLHDL 4.6 07/22/2022 1522   VLDL 52 (H) 07/22/2022 1522   LDLCALC 67 11/16/2022 1027    PHYSICAL EXAM:    VS:  BP (!) 140/58 (BP Location: Left Arm, Patient Position: Sitting, Cuff Size: Normal)   Pulse (!) 59   Ht 5\' 6"  (1.676 m)   Wt 167 lb 6.4 oz (75.9 kg)   SpO2 97%   BMI 27.02 kg/m   BMI: Body mass index is 27.02 kg/m.  Physical Exam Vitals reviewed.  Constitutional:      Appearance: He is well-developed.  HENT:     Head: Normocephalic and atraumatic.  Eyes:      General:        Right eye: No discharge.        Left eye: No discharge.  Cardiovascular:     Rate and Rhythm: Normal rate and regular rhythm.     Heart sounds: Normal heart sounds, S1 normal and S2 normal. Heart sounds not distant. No midsystolic click and no opening snap. No murmur heard.    No friction rub.  Pulmonary:     Effort: Pulmonary effort is normal. No respiratory distress.     Breath sounds: Normal breath sounds. No decreased breath sounds, wheezing, rhonchi or rales.  Chest:     Chest wall: No tenderness.  Musculoskeletal:     Cervical back: Normal range of motion.     Right lower leg: No edema.     Left lower leg: No edema.  Skin:    General: Skin is warm and dry.     Nails: There is no clubbing.  Neurological:     Mental Status: He is alert and oriented to person, place, and time.  Psychiatric:        Speech: Speech normal.        Behavior: Behavior normal.        Thought Content: Thought content normal.        Judgment: Judgment normal.     Wt Readings from Last 3 Encounters:  04/27/23 167 lb 6.4 oz (75.9 kg)  11/16/22 173 lb 4 oz (78.6 kg)  10/31/22 164 lb 6.4 oz (74.6 kg)     ASSESSMENT & PLAN:   CAD involving the native coronary arteries without angina: He is doing well and without symptoms concerning for angina.  Continue aggressive risk factor modification and secondary prevention including aspirin 81 mg, clopidogrel 75 mg, atorvastatin 80 mg, isosorbide mononitrate 30 mg, and metoprolol succinate 12.5 mg.  We will continue DAPT for now given in-stent restenosis of prior LAD stent.  No indication for further ischemic testing at this time.  HTN: Blood pressure is mildly elevated in the office today, though he has not yet taken his medications.  For now, continue isosorbide mononitrate 30 mg, lisinopril 10 mg, and metoprolol succinate 12.5 mg.  Low-sodium diet encouraged.  HLD with hypertriglyceridemia: LDL 67 with triglyceride 149 in 11/2022.  Continue  atorvastatin 80 mg.  Carotid artery stenosis: Carotid Doppler in 04/2022 showed 40 to 59% bilateral internal carotid artery stenoses.  Remains on aspirin and atorvastatin as outlined above.  Has follow-up imaging and appointment with vascular surgery later this month.    Disposition: F/u with Dr. Okey Dupre or  an APP in 6 months.   Medication Adjustments/Labs and Tests Ordered: Current medicines are reviewed at length with the patient today.  Concerns regarding medicines are outlined above. Medication changes, Labs and Tests ordered today are summarized above and listed in the Patient Instructions accessible in Encounters.   Signed, Eula Listen, PA-C 04/27/2023 8:36 AM     [facility] [place] [address] [place], [place] [address] [phone]

## 2023-04-27 ENCOUNTER — Encounter: Payer: Self-pay | Admitting: Physician Assistant

## 2023-04-27 ENCOUNTER — Ambulatory Visit: Payer: Medicare HMO | Attending: Physician Assistant | Admitting: Physician Assistant

## 2023-04-27 VITALS — BP 140/58 | HR 59 | Ht 66.0 in | Wt 167.4 lb

## 2023-04-27 DIAGNOSIS — I2510 Atherosclerotic heart disease of native coronary artery without angina pectoris: Secondary | ICD-10-CM

## 2023-04-27 DIAGNOSIS — E785 Hyperlipidemia, unspecified: Secondary | ICD-10-CM

## 2023-04-27 DIAGNOSIS — I10 Essential (primary) hypertension: Secondary | ICD-10-CM

## 2023-04-27 DIAGNOSIS — I6523 Occlusion and stenosis of bilateral carotid arteries: Secondary | ICD-10-CM

## 2023-04-27 NOTE — Patient Instructions (Signed)
Medication Instructions:  Your Physician recommend you continue on your current medication as directed.    *If you need a refill on your cardiac medications before your next appointment, please call your pharmacy*   Lab Work: None ordered at this time  If you have labs (blood work) drawn today and your tests are completely normal, you will receive your results only by: MyChart Message (if you have MyChart) OR A paper copy in the mail If you have any lab test that is abnormal or we need to change your treatment, we will call you to review the results.   Follow-Up: At [facility], you and your health needs are our priority.  As part of our continuing mission to provide you with exceptional heart care, we have created designated Provider Care Teams.  These Care Teams include your primary Cardiologist (physician) and Advanced Practice Providers (APPs -  Physician Assistants and Nurse Practitioners) who all work together to provide you with the care you need, when you need it.  We recommend signing up for the patient portal called "MyChart".  Sign up information is provided on this After Visit Summary.  MyChart is used to connect with patients for Virtual Visits (Telemedicine).  Patients are able to view lab/test results, encounter notes, upcoming appointments, etc.  Non-urgent messages can be sent to your provider as well.   To learn more about what you can do with MyChart, go to [email or web address].    Your next appointment:   6 month(s)  Provider:   You may see Yvonne Kendall, MD or one of the following Advanced Practice Providers on your designated Care Team:   Nicolasa Ducking, NP Eula Listen, PA-C Cadence Fransico Michael, PA-C Charlsie Quest, NP Carlos Levering, NP

## 2023-05-10 ENCOUNTER — Other Ambulatory Visit (INDEPENDENT_AMBULATORY_CARE_PROVIDER_SITE_OTHER): Payer: Self-pay | Admitting: Vascular Surgery

## 2023-05-10 DIAGNOSIS — I6523 Occlusion and stenosis of bilateral carotid arteries: Secondary | ICD-10-CM

## 2023-05-10 NOTE — Progress Notes (Signed)
 MRN : [id number]  Daniel Miles is a 78 y.o. (July 02, 1945) male who presents with chief complaint of check carotid arteries.  History of Present Illness:   The patient is seen for follow up evaluation of carotid stenosis. The carotid stenosis followed by ultrasound.    The patient denies amaurosis fugax. There is no recent history of TIA symptoms or focal motor deficits. There is no prior documented CVA.   The patient is taking enteric-coated aspirin 81 mg daily.   There is no history of migraine headaches. There is no history of seizures.   The patient has a history of coronary artery disease, no recent episodes of angina or shortness of breath. The patient denies PAD or claudication symptoms. There is a history of hyperlipidemia which is being treated with a statin.     Carotid Duplex done 10/2022 shows RICA 40-59% and LICA <40%.  No significant change compared to last study  No outpatient medications have been marked as taking for the 05/11/23 encounter (Appointment) with Gilda Crease, Latina Craver, MD.    Past Medical History:  Diagnosis Date   ASHD (arteriosclerotic heart disease)    Bilateral carotid artery stenosis    CAD (coronary artery disease)    Diverticulitis    ED (erectile dysfunction)    Heart attack (HCC)    HTN (hypertension)    Mixed hyperlipidemia    Psoriasis    Psoriasis     Past Surgical History:  Procedure Laterality Date   CORONARY ANGIOPLASTY     CORONARY BALLOON ANGIOPLASTY N/A 07/27/2022   Procedure: CORONARY BALLOON ANGIOPLASTY;  Surgeon: Yvonne Kendall, MD;  Location: [facility] INVASIVE CV LAB;  Service: Cardiovascular;  Laterality: N/A;   LEFT HEART CATH AND CORONARY ANGIOGRAPHY Left 07/27/2022   Procedure: LEFT HEART CATH AND CORONARY ANGIOGRAPHY;  Surgeon: Yvonne Kendall, MD;  Location: [facility] INVASIVE CV LAB;  Service: Cardiovascular;  Laterality: Left;   TONSILLECTOMY      Social History Social History   Tobacco Use    Smoking status: Former    Types: Cigarettes   Smokeless tobacco: Never   Tobacco comments:    quit over 50 years  Vaping Use   Vaping status: Never Used  Substance Use Topics   Alcohol use: Yes    Alcohol/week: 0.0 standard drinks of alcohol    Comment: rarely   Drug use: No    Family History Family History  Problem Relation Age of Onset   Stroke Mother    Lung cancer Father    Hearing loss Brother    Kidney disease Neg Hx    Prostate cancer Neg Hx     Allergies  Allergen Reactions   Penicillin G      REVIEW OF SYSTEMS (Negative unless checked)  Constitutional: [] Weight loss  [] Fever  [] Chills Cardiac: [] Chest pain   [] Chest pressure   [] Palpitations   [] Shortness of breath when laying flat   [] Shortness of breath with exertion. Vascular:  [x] Pain in legs with walking   [] Pain in legs at rest  [] History of DVT   [] Phlebitis   [] Swelling in legs   [] Varicose veins   [] Non-healing ulcers Pulmonary:   [] Uses home oxygen   [] Productive cough   [] Hemoptysis   [] Wheeze  [] COPD   [] Asthma Neurologic:  [] Dizziness   [] Seizures   [] History of stroke   [] History of TIA  []   Aphasia   [] Vissual changes   [] Weakness or numbness in arm   [] Weakness or numbness in leg Musculoskeletal:   [] Joint swelling   [] Joint pain   [] Low back pain Hematologic:  [] Easy bruising  [] Easy bleeding   [] Hypercoagulable state   [] Anemic Gastrointestinal:  [] Diarrhea   [] Vomiting  [x] Gastroesophageal reflux/heartburn   [] Difficulty swallowing. Genitourinary:  [] Chronic kidney disease   [] Difficult urination  [] Frequent urination   [] Blood in urine Skin:  [] Rashes   [] Ulcers  Psychological:  [] History of anxiety   []  History of major depression.  Physical Examination  There were no vitals filed for this visit. There is no height or weight on file to calculate BMI. Gen: WD/WN, NAD Head: [place]/AT, No temporalis wasting.  Ear/Nose/Throat: Hearing grossly intact, nares w/o erythema or drainage Eyes: PER,  EOMI, sclera nonicteric.  Neck: Supple, no masses.  No bruit or JVD.  Pulmonary:  Good air movement, no audible wheezing, no use of accessory muscles.  Cardiac: RRR, normal S1, S2, no Murmurs. Vascular:  carotid bruit noted Vessel Right Left  Radial Palpable Palpable  Carotid  Palpable  Palpable  Subclav  Palpable Palpable  Gastrointestinal: soft, non-distended. No guarding/no peritoneal signs.  Musculoskeletal: M/S 5/5 throughout.  No visible deformity.  Neurologic: CN 2-12 intact. Pain and light touch intact in extremities.  Symmetrical.  Speech is fluent. Motor exam as listed above. Psychiatric: Judgment intact, Mood & affect appropriate for pt's clinical situation. Dermatologic: No rashes or ulcers noted.  No changes consistent with cellulitis.   CBC Lab Results  Component Value Date   WBC 10.7 (H) 07/28/2022   HGB 14.1 07/28/2022   HCT 43.6 07/28/2022   MCV 89.5 07/28/2022   PLT 235 07/28/2022    BMET    Component Value Date/Time   NA 142 11/16/2022 1027   NA 140 03/04/2014 0115   K 4.1 11/16/2022 1027   K 4.1 03/04/2014 0115   CL 103 11/16/2022 1027   CL 104 03/04/2014 0115   CO2 24 11/16/2022 1027   CO2 29 03/04/2014 0115   GLUCOSE 105 (H) 11/16/2022 1027   GLUCOSE 92 07/28/2022 0503   GLUCOSE 102 (H) 03/04/2014 0115   BUN 17 11/16/2022 1027   BUN 16 03/04/2014 0115   CREATININE 1.05 11/16/2022 1027   CREATININE 1.29 03/04/2014 0115   CALCIUM 9.1 11/16/2022 1027   CALCIUM 8.0 (L) 03/04/2014 0115   GFRNONAA >60 07/28/2022 0503   GFRNONAA 59 (L) 03/04/2014 0115   GFRAA >60 03/04/2014 0115   CrCl cannot be calculated (Patient's most recent lab result is older than the maximum 21 days allowed.).  COAG No results found for: "INR", "PROTIME"  Radiology No results found.   Assessment/Plan 1. Bilateral carotid artery stenosis (Primary) Recommend:   Given the patient's asymptomatic subcritical stenosis no further invasive testing or surgery at this time.    Duplex ultrasound shows RICA 40-59% and 1-39% LICA stenosis bilaterally.   Continue antiplatelet therapy as prescribed Continue management of CAD, HTN and Hyperlipidemia Healthy heart diet,  encouraged exercise at least 4 times per week Follow up in 6 months with duplex ultrasound and physical exam  - VAS US CAROTID; Future  2. Essential hypertension Continue antihypertensive medications as already ordered, these medications have been reviewed and there are no changes at this time.  3. Coronary artery disease involving native coronary artery of native heart with unstable angina pectoris (HCC) Continue cardiac and antihypertensive medications as already ordered and reviewed, no changes at this time.  Continue statin as ordered and reviewed, no changes at this time  Nitrates PRN for chest pain  4. Mixed hyperlipidemia Continue statin as ordered and reviewed, no changes at this time    Levora Dredge, MD  05/10/2023 8:43 PM

## 2023-05-11 ENCOUNTER — Ambulatory Visit (INDEPENDENT_AMBULATORY_CARE_PROVIDER_SITE_OTHER): Payer: Medicare HMO | Admitting: Vascular Surgery

## 2023-05-11 ENCOUNTER — Encounter (INDEPENDENT_AMBULATORY_CARE_PROVIDER_SITE_OTHER): Payer: Self-pay | Admitting: Vascular Surgery

## 2023-05-11 ENCOUNTER — Ambulatory Visit (INDEPENDENT_AMBULATORY_CARE_PROVIDER_SITE_OTHER): Payer: Medicare HMO

## 2023-05-11 VITALS — BP 123/70 | HR 57 | Resp 18 | Ht 67.0 in | Wt 173.0 lb

## 2023-05-11 DIAGNOSIS — E782 Mixed hyperlipidemia: Secondary | ICD-10-CM

## 2023-05-11 DIAGNOSIS — I6523 Occlusion and stenosis of bilateral carotid arteries: Secondary | ICD-10-CM

## 2023-05-11 DIAGNOSIS — I25110 Atherosclerotic heart disease of native coronary artery with unstable angina pectoris: Secondary | ICD-10-CM

## 2023-05-11 DIAGNOSIS — I10 Essential (primary) hypertension: Secondary | ICD-10-CM | POA: Diagnosis not present

## 2023-05-14 ENCOUNTER — Encounter (INDEPENDENT_AMBULATORY_CARE_PROVIDER_SITE_OTHER): Payer: Self-pay | Admitting: Vascular Surgery

## 2023-08-01 ENCOUNTER — Encounter (INDEPENDENT_AMBULATORY_CARE_PROVIDER_SITE_OTHER): Payer: Self-pay

## 2024-01-22 NOTE — Progress Notes (Deleted)
 Cardiology Office Note    Date:  01/22/2024   ID:  Daltin, Crist 1945-12-25, MRN [id number]  PCP:  Jeffie Cheryl BRAVO, MD  Cardiologist:  Lonni Hanson, MD  Electrophysiologist:  None   Chief Complaint: Follow up  History of Present Illness:   Daniel Miles is a 78 y.o. male with history of CAD status post PCI's, carotid artery stenosis, HTN, and HLD who presents for follow-up of CAD.   He was previously followed by Dr. Hester with [facility] cardiology.  LHC in 2015 showed occlusion of previously placed mid LAD stent treated with PCI with moderate LCx and RCA disease noted.  He underwent nuclear stress testing with [facility] cardiology in 08/2021 which showed reversible anterior and apical myocardial perfusion defect consistent with myocardial ischemia in the distribution of previously treated LAD.  LVEF estimated at 51%.  Echo at that time demonstrated an LVEF greater than 55%, normal wall motion and diastolic dysfunction, and trivial mitral regurgitation.  Further evaluation was not pursued.  Carotid Doppler in 04/2022 showed 40 to 59% bilateral ICA stenoses, greater than 50% right external carotid artery stenosis, antegrade flow in the bilateral vertebral arteries, and normal flow hemodynamics in the bilateral subclavian arteries   Upon establishing care with Dr. Hanson on 07/21/2022, he noted intermittent chest discomfort that would last for a few seconds at a time, though occur intermittently all day long.  Discomfort was similar to what he experienced prior to past PCIs.  Given symptoms, and in the setting of his abnormal stress test a year prior, he underwent LHC on 07/27/2022 that showed significant two-vessel CAD with 99% in-stent restenosis mid LAD and 90% ostial rPDA stenosis with left-to-right collaterals.  Moderate left main, proximal LAD, and RCA disease was also noted.  Normal LVEDP.  He underwent successful PTCA to mid LAD in-stent restenosis with 10% residual stenosis  and TIMI-3 flow noted.  He was last seen in the office in 04/2023 and was doing well from a cardiac perspective with no changes in cardiac pharmacotherapy indicated at that time.  ***   Labs independently reviewed: 08/2023 - potassium 4.7, BUN 20, serum creatinine 1.1, albumin 4.5, AST/ALT normal, TC 123, TG 150, HDL 35, LDL 57, A1c 6.0 02/2023 - Hgb 13.9, PLT 205 07/2015 - TSH normal   Past Medical History:  Diagnosis Date   ASHD (arteriosclerotic heart disease)    Bilateral carotid artery stenosis    CAD (coronary artery disease)    Diverticulitis    ED (erectile dysfunction)    Heart attack (HCC)    HTN (hypertension)    Mixed hyperlipidemia    Psoriasis    Psoriasis     Past Surgical History:  Procedure Laterality Date   CORONARY ANGIOPLASTY     CORONARY BALLOON ANGIOPLASTY N/A 07/27/2022   Procedure: CORONARY BALLOON ANGIOPLASTY;  Surgeon: Hanson Lonni, MD;  Location: [facility] INVASIVE CV LAB;  Service: Cardiovascular;  Laterality: N/A;   LEFT HEART CATH AND CORONARY ANGIOGRAPHY Left 07/27/2022   Procedure: LEFT HEART CATH AND CORONARY ANGIOGRAPHY;  Surgeon: Hanson Lonni, MD;  Location: [facility] INVASIVE CV LAB;  Service: Cardiovascular;  Laterality: Left;   TONSILLECTOMY      Current Medications: No outpatient medications have been marked as taking for the 01/23/24 encounter (Appointment) with Abigail Bernardino HERO, PA-C.    Allergies:   Penicillin g   Social History   Socioeconomic History   Marital status: Married    Spouse name: Not on file   Number  of children: Not on file   Years of education: Not on file   Highest education level: Not on file  Occupational History   Not on file  Tobacco Use   Smoking status: Former    Types: Cigarettes   Smokeless tobacco: Never   Tobacco comments:    quit over 50 years  Vaping Use   Vaping status: Never Used  Substance and Sexual Activity   Alcohol use: Yes    Alcohol/week: 0.0 standard drinks of alcohol    Comment: rarely    Drug use: No   Sexual activity: Not Currently  Other Topics Concern   Not on file  Social History Narrative   Not on file   Social Drivers of Health   Financial Resource Strain: Medium Risk (08/31/2023)   [facility]   Overall Financial Resource Strain (CARDIA)    Difficulty of Paying Living Expenses: Somewhat hard  Food Insecurity: No Food Insecurity (08/31/2023)   Received from [facility]   Hunger Vital Sign    Within the past 12 months, you worried that your food would run out before you got the money to buy more.: Never true    Within the past 12 months, the food you bought just didn't last and you didn't have money to get more.: Never true  Transportation Needs: No Transportation Needs (08/31/2023)   Received from [facility] - Transportation    In the past 12 months, has lack of transportation kept you from medical appointments or from getting medications?: No    Lack of Transportation (Non-Medical): No  Physical Activity: Not on file  Stress: Not on file  Social Connections: Not on file     Family History:  The patient's family history includes Hearing loss in his brother; Lung cancer in his father; Stroke in his mother. There is no history of Kidney disease or Prostate cancer.  ROS:   12-point review of systems is negative unless otherwise noted in the HPI.   EKGs/Labs/Other Studies Reviewed:    Studies reviewed were summarized above. The additional studies were reviewed today:  LHC 07/27/2022: Conclusions: Significant two-vessel coronary artery disease with 99% in-stent restenosis of mid LAD and 90% ostial rPDA lesion with left-to-right collaterals. Moderate LMCA, proximal LAD, and RCA disease also noted. Normal left ventricular filling pressure (LVEDP 12 mmHg). Successful PTCA to mid LAD in-stent restenosis with 10% residual stenosis and TIMI-3 flow.   Recommendations: Overnight  observation. Continue dual antiplatelet therapy with aspirin  and clopidogrel  for at least 1 month, ideally longer. Continue antianginal therapy for residual disease; ostial rPDA lesion is not well-suited for PCI. Aggressive secondary prevention of coronary artery disease. __________   Morris MPI Avelina) 08/19/2021: Indeterminate Lexiscan infusion EKG due to baseline EKG changes and  nonspecific T wave changes  Reversible anterior and apical myocardial perfusion defect consistent with  myocardial ischemia which is in the distribution of his previously  occluded left anterior descending artery  Clinical correlation advised  __________   2D echo [facility]ary) 08/19/2021: INTERPRETATION  NORMAL LEFT VENTRICULAR SYSTOLIC FUNCTION  NORMAL RIGHT VENTRICULAR SYSTOLIC FUNCTION  TRIVIAL REGURGITATION NOTED (See above)  NO VALVULAR STENOSIS  __________   See Epic for more remote studies   EKG:  EKG is ordered today.  The EKG ordered today demonstrates ***  Recent Labs: No results found for requested labs within last 365 days.  Recent Lipid Panel    Component Value Date/Time  CHOL 127 11/16/2022 1027   TRIG 149 11/16/2022 1027   HDL 34 (L) 11/16/2022 1027   CHOLHDL 3.7 11/16/2022 1027   CHOLHDL 4.6 07/22/2022 1522   VLDL 52 (H) 07/22/2022 1522   LDLCALC 67 11/16/2022 1027    PHYSICAL EXAM:    VS:  There were no vitals taken for this visit.  BMI: There is no height or weight on file to calculate BMI.  Physical Exam  Wt Readings from Last 3 Encounters:  05/11/23 173 lb (78.5 kg)  04/27/23 167 lb 6.4 oz (75.9 kg)  11/16/22 173 lb 4 oz (78.6 kg)     ASSESSMENT & PLAN:   CAD involving the native coronary arteries without angina:  HTN: Blood pressure  HLD with hypertriglyceridemia: LDL 57 with a triglyceride of 150 in 08/2023 with normal AST/ALT at that time.  Carotid artery stenosis: Ultrasound from 04/2023 showed 40 to 59% right ICA stenosis with 1 to 39% left ICA  stenosis.   {Are you ordering a CV Procedure (e.g. stress test, cath, DCCV, TEE, etc)?   Press F2        :[id number]}     Disposition: F/u with Dr. Mady or an APP in ***.   Medication Adjustments/Labs and Tests Ordered: Current medicines are reviewed at length with the patient today.  Concerns regarding medicines are outlined above. Medication changes, Labs and Tests ordered today are summarized above and listed in the Patient Instructions accessible in Encounters.   Signed, Bernardino Bring, PA-C 01/22/2024 1:11 PM     [facility] -  [address] [place], [place] [address] [phone]

## 2024-01-23 ENCOUNTER — Ambulatory Visit: Attending: Physician Assistant | Admitting: Physician Assistant

## 2024-01-25 ENCOUNTER — Encounter: Payer: Self-pay | Admitting: Physician Assistant

## 2024-02-22 NOTE — Progress Notes (Signed)
 Cardiology Office Note    Date:  02/27/2024   ID:  Daniel Miles, Daniel Miles 03-29-1945, MRN [id number]  PCP:  Jeffie Cheryl BRAVO, MD  Cardiologist:  Lonni Hanson, MD  Electrophysiologist:  None   Chief Complaint: Follow up  History of Present Illness:   Daniel Miles is a 78 y.o. male with history of CAD status post PCI's, carotid artery stenosis, HTN, and HLD who presents for follow-up of CAD.  He was previously followed by Dr. Hester with [facility] cardiology.  LHC in 2015 showed occlusion of previously placed mid LAD stent treated with PCI with moderate LCx and RCA disease noted.  He underwent nuclear stress testing with [facility] cardiology in 08/2021 which showed reversible anterior and apical myocardial perfusion defect consistent with myocardial ischemia in the distribution of previously treated LAD.  LVEF estimated at 51%.  Echo at that time demonstrated an LVEF greater than 55%, normal wall motion and diastolic dysfunction, and trivial mitral regurgitation.  Further evaluation was not pursued.  Carotid Doppler in 04/2022 showed 40 to 59% bilateral ICA stenoses, greater than 50% right external carotid artery stenosis, antegrade flow in the bilateral vertebral arteries, and normal flow hemodynamics in the bilateral subclavian arteries   Upon establishing care with Dr. Hanson on 07/21/2022, he noted intermittent chest discomfort that would last for a few seconds at a time, though occur intermittently all day long.  Discomfort was similar to what he experienced prior to past PCIs.  Given symptoms, and in the setting of his abnormal stress test a year prior, he underwent LHC on 07/27/2022 that showed significant two-vessel CAD with 99% in-stent restenosis mid LAD and 90% ostial rPDA stenosis with left-to-right collaterals.  Moderate left main, proximal LAD, and RCA disease was also noted.  Normal LVEDP.  He underwent successful PTCA to mid LAD in-stent restenosis with 10% residual stenosis  and TIMI-3 flow noted.  He was last seen in the office in 04/2023 and was doing well from a cardiac perspective, without changes in pharmacotherapy pursued at that time.  He comes in continuing to do very well from a cardiac perspective and is without symptoms of angina or cardiac decompensation.  No dyspnea, palpitations, dizziness, presyncope, or syncope.  No falls, hematochezia, or melena.  No lower extremity swelling or progressive orthopnea.  Adherent to cardiac pharmacotherapy without off target effect.  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  Overall feels very well at this time and does not have any acute cardiac concerns currently.   Labs independently reviewed: 08/2023 - potassium 4.7, BUN 20, serum creatinine 1.1, albumin 4.5, AST/ALT normal, TC 123, TG 150, HDL 35, LDL 57, A1c 6.0 02/2023 - Hgb 13.9, PLT 205 07/2022 - LP(a) 202  Past Medical History:  Diagnosis Date   ASHD (arteriosclerotic heart disease)    Bilateral carotid artery stenosis    CAD (coronary artery disease)    Diverticulitis    ED (erectile dysfunction)    Heart attack (HCC)    HTN (hypertension)    Mixed hyperlipidemia    Psoriasis    Psoriasis     Past Surgical History:  Procedure Laterality Date   CORONARY ANGIOPLASTY     CORONARY BALLOON ANGIOPLASTY N/A 07/27/2022   Procedure: CORONARY BALLOON ANGIOPLASTY;  Surgeon: Hanson Lonni, MD;  Location: [facility] INVASIVE CV LAB;  Service: Cardiovascular;  Laterality: N/A;   LEFT HEART CATH AND CORONARY ANGIOGRAPHY Left 07/27/2022  Procedure: LEFT HEART CATH AND CORONARY ANGIOGRAPHY;  Surgeon: Mady Bruckner, MD;  Location: [facility] INVASIVE CV LAB;  Service: Cardiovascular;  Laterality: Left;   TONSILLECTOMY      Current Medications: Active Medications[1]  Allergies:   Penicillin g   Social History   Socioeconomic History   Marital status: Married    Spouse name: Not on  file   Number of children: Not on file   Years of education: Not on file   Highest education level: Not on file  Occupational History   Not on file  Tobacco Use   Smoking status: Former    Types: Cigarettes   Smokeless tobacco: Never   Tobacco comments:    quit over 50 years  Vaping Use   Vaping status: Never Used  Substance and Sexual Activity   Alcohol use: Yes    Alcohol/week: 0.0 standard drinks of alcohol    Comment: rarely   Drug use: No   Sexual activity: Not Currently  Other Topics Concern   Not on file  Social History Narrative   Not on file   Social Drivers of Health   Tobacco Use: Medium Risk (02/27/2024)   Patient History    Smoking Tobacco Use: Former    Smokeless Tobacco Use: Never    Passive Exposure: Not on file  Financial Resource Strain: Medium Risk (08/31/2023)   Received from [facility]   Overall Financial Resource Strain (CARDIA)    Difficulty of Paying Living Expenses: Somewhat hard  Food Insecurity: No Food Insecurity (08/31/2023)   Received from [facility]   Epic    Within the past 12 months, you worried that your food would run out before you got the money to buy more.: Never true    Within the past 12 months, the food you bought just didn't last and you didn't have money to get more.: Never true  Transportation Needs: No Transportation Needs (08/31/2023)   Received from [facility] - Transportation    In the past 12 months, has lack of transportation kept you from medical appointments or from getting medications?: No    Lack of Transportation (Non-Medical): No  Physical Activity: Not on file  Stress: Not on file  Social Connections: Not on file  Depression (EYV7-0): Not on file  Alcohol Screen: Not on file  Housing: Low Risk  (08/31/2023)   Received from [facility]   Epic    In the last 12 months, was there a time when you were not able to pay the mortgage  or rent on time?: No    In the past 12 months, how many times have you moved where you were living?: 0    At any time in the past 12 months, were you homeless or living in a shelter (including now)?: No  Utilities: Not At Risk (08/31/2023)   Received from [facility]   Epic    In the past 12 months has the electric, gas, oil, or water company threatened to shut off services in your home?: No  Health Literacy: Not on file     Family History:  The patient's family history includes Hearing loss in his brother; Lung cancer in his father; Stroke in his mother. There is no history of Kidney disease or Prostate cancer.  ROS:   12-point review of systems is negative unless otherwise noted in the HPI.   EKGs/Labs/Other Studies Reviewed:    Studies  reviewed were summarized above. The additional studies were reviewed today:  LHC 07/27/2022: Conclusions: Significant two-vessel coronary artery disease with 99% in-stent restenosis of mid LAD and 90% ostial rPDA lesion with left-to-right collaterals. Moderate LMCA, proximal LAD, and RCA disease also noted. Normal left ventricular filling pressure (LVEDP 12 mmHg). Successful PTCA to mid LAD in-stent restenosis with 10% residual stenosis and TIMI-3 flow.   Recommendations: Overnight observation. Continue dual antiplatelet therapy with aspirin  and clopidogrel  for at least 1 month, ideally longer. Continue antianginal therapy for residual disease; ostial rPDA lesion is not well-suited for PCI. Aggressive secondary prevention of coronary artery disease. __________   [facility] MPI Avelina) 08/19/2021: Indeterminate Lexiscan infusion EKG due to baseline EKG changes and  nonspecific T wave changes  Reversible anterior and apical myocardial perfusion defect consistent with  myocardial ischemia which is in the distribution of his previously  occluded left anterior descending artery  Clinical correlation advised  __________   2D echo  [facility]) 08/19/2021: INTERPRETATION  NORMAL LEFT VENTRICULAR SYSTOLIC FUNCTION  NORMAL RIGHT VENTRICULAR SYSTOLIC FUNCTION  TRIVIAL REGURGITATION NOTED (See above)  NO VALVULAR STENOSIS  __________   See Epic for more remote studies   EKG:  EKG is ordered today.  The EKG ordered today demonstrates sinus bradycardia, 54 bpm, improved nonspecific ST-T changes  Recent Labs: No results found for requested labs within last 365 days.  Recent Lipid Panel    Component Value Date/Time   CHOL 127 11/16/2022 1027   TRIG 149 11/16/2022 1027   HDL 34 (L) 11/16/2022 1027   CHOLHDL 3.7 11/16/2022 1027   CHOLHDL 4.6 07/22/2022 1522   VLDL 52 (H) 07/22/2022 1522   LDLCALC 67 11/16/2022 1027    PHYSICAL EXAM:    VS:  BP 120/60 (BP Location: Left Arm, Patient Position: Sitting, Cuff Size: Normal)   Pulse (!) 54 Comment: 59 oximeter  Ht 5' 6 (1.676 m)   Wt 176 lb 6.4 oz (80 kg)   SpO2 98%   BMI 28.47 kg/m   BMI: Body mass index is 28.47 kg/m.  Physical Exam Vitals reviewed.  Constitutional:      Appearance: He is well-developed.  HENT:     Head: Normocephalic and atraumatic.  Eyes:     General:        Right eye: No discharge.        Left eye: No discharge.  Cardiovascular:     Rate and Rhythm: Normal rate and regular rhythm.     Heart sounds: Normal heart sounds, S1 normal and S2 normal. Heart sounds not distant. No midsystolic click and no opening snap. No murmur heard.    No friction rub.  Pulmonary:     Effort: Pulmonary effort is normal. No respiratory distress.     Breath sounds: Normal breath sounds. No decreased breath sounds, wheezing, rhonchi or rales.  Musculoskeletal:     Cervical back: Normal range of motion.     Right lower leg: No edema.     Left lower leg: No edema.  Skin:    General: Skin is warm and dry.     Nails: There is no clubbing.  Neurological:     Mental Status: He is alert and oriented to person, place, and time.  Psychiatric:        Speech:  Speech normal.        Behavior: Behavior normal.        Thought Content: Thought content normal.        Judgment: Judgment normal.  Wt Readings from Last 3 Encounters:  02/27/24 176 lb 6.4 oz (80 kg)  05/11/23 173 lb (78.5 kg)  04/27/23 167 lb 6.4 oz (75.9 kg)     ASSESSMENT & PLAN:   CAD involving the native coronary arteries without angina: He continues to do very well and is without symptoms of angina or cardiac decompensation.  Continue aggressive risk factor modification and secondary prevention including aspirin  81 mg, clopidogrel  75 mg, atorvastatin  80 mg, isosorbide  mononitrate 30 mg, and metoprolol  succinate 12.5 mg.  We will continue DAPT for now given in-stent restenosis of prior LAD stent.  No indication for further ischemic testing at this time.  HTN: Blood pressure is well-controlled in the office today.  He remains on isosorbide  mononitrate 30 mg, lisinopril  10 mg, and metoprolol  succinate 12.5 mg.  HLD with hypertriglyceridemia: LDL 57 with a triglyceride of 150 in 08/2023.  He remains on atorvastatin  80 mg.  Carotid artery stenosis: Ultrasound in 04/2023 showed 40 to 59% right ICA stenosis with 1 to 39% left ICA stenosis, which was stable when compared to ultrasound from 10/2022.  He remains on aspirin  and statin as outlined above.  Followed by vascular surgery.    Disposition: F/u with Dr. Mady or an APP in 6 months.   Medication Adjustments/Labs and Tests Ordered: Current medicines are reviewed at length with the patient today.  Concerns regarding medicines are outlined above. Medication changes, Labs and Tests ordered today are summarized above and listed in the Patient Instructions accessible in Encounters.   SignedBernardino Bring, PA-C 02/27/2024 10:02 AM     [facility] - [place] [address] [place], [place] [address] [phone]     [1]  Current Meds  Medication Sig   Apoaequorin (PREVAGEN PO) Take 1 tablet by mouth daily.    Ascorbic Acid (VITAMIN C) 1000 MG tablet Take 1,000 mg by mouth daily.   aspirin  EC 81 MG tablet Take 81 mg by mouth daily. Swallow whole.   atorvastatin  (LIPITOR ) 80 MG tablet TAKE 1 TABLET BY MOUTH EVERY DAY   Cholecalciferol (VITAMIN D3) 50 MCG CAPS Take 1 capsule by mouth daily.   clopidogrel  (PLAVIX ) 75 MG tablet Take 75 mg by mouth daily.   glucosamine-chondroitin 500-400 MG tablet Take 1 tablet by mouth daily at 2 am.   isosorbide  mononitrate (IMDUR ) 30 MG 24 hr tablet Take 30 mg by mouth daily.   lisinopril  (ZESTRIL ) 10 MG tablet Take 1 tablet by mouth daily.   metoprolol  succinate (TOPROL -XL) 25 MG 24 hr tablet TAKE 1/2 TABLET BY MOUTH EVERY DAY   Multiple Vitamins-Minerals (SPECTRAVITE PO) Take by mouth.   nitroGLYCERIN  (NITROSTAT ) 0.4 MG SL tablet Place 1 tablet (0.4 mg total) under the tongue every 5 (five) minutes as needed for chest pain.   Omega-3 Fatty Acids (FISH OIL PO) Take 1,200 mg by mouth daily.   Plant Sterols and Stanols (CHOLEST OFF PO) Take 1 tablet by mouth daily.   vitamin E 400 UNIT capsule Take 400 Units by mouth daily.

## 2024-02-27 ENCOUNTER — Encounter: Payer: Self-pay | Admitting: Physician Assistant

## 2024-02-27 ENCOUNTER — Ambulatory Visit: Attending: Physician Assistant | Admitting: Physician Assistant

## 2024-02-27 VITALS — BP 120/60 | HR 54 | Ht 66.0 in | Wt 176.4 lb

## 2024-02-27 DIAGNOSIS — I2510 Atherosclerotic heart disease of native coronary artery without angina pectoris: Secondary | ICD-10-CM

## 2024-02-27 DIAGNOSIS — I10 Essential (primary) hypertension: Secondary | ICD-10-CM

## 2024-02-27 DIAGNOSIS — I6523 Occlusion and stenosis of bilateral carotid arteries: Secondary | ICD-10-CM

## 2024-02-27 DIAGNOSIS — E785 Hyperlipidemia, unspecified: Secondary | ICD-10-CM

## 2024-02-27 NOTE — Patient Instructions (Signed)
 Medication Instructions:  Your physician recommends that you continue on your current medications as directed. Please refer to the Current Medication list given to you today.   *If you need a refill on your cardiac medications before your next appointment, please call your pharmacy*  Lab Work: None ordered at this time   Follow-Up: At [facility], you and your health needs are our priority.  As part of our continuing mission to provide you with exceptional heart care, our providers are all part of one team.  This team includes your primary Cardiologist (physician) and Advanced Practice Providers or APPs (Physician Assistants and Nurse Practitioners) who all work together to provide you with the care you need, when you need it.  Your next appointment:   6 month(s)  Provider:   You may see Lonni Hanson, MD or Bernardino Bring, PA-C  We recommend signing up for the patient portal called MyChart.  Sign up information is provided on this After Visit Summary.  MyChart is used to connect with patients for Virtual Visits (Telemedicine).  Patients are able to view lab/test results, encounter notes, upcoming appointments, etc.  Non-urgent messages can be sent to your provider as well.   To learn more about what you can do with MyChart, go to [email or web address].

## 2024-05-09 ENCOUNTER — Encounter (INDEPENDENT_AMBULATORY_CARE_PROVIDER_SITE_OTHER): Payer: Medicare HMO

## 2024-05-09 ENCOUNTER — Ambulatory Visit (INDEPENDENT_AMBULATORY_CARE_PROVIDER_SITE_OTHER): Payer: Medicare HMO | Admitting: Vascular Surgery

## 2024-09-02 ENCOUNTER — Ambulatory Visit: Admitting: Physician Assistant
